# Patient Record
Sex: Female | Born: 1945 | Race: Black or African American | Hispanic: No | Marital: Single | Smoking: Former smoker
Health system: Southern US, Community
[De-identification: ages and names within clinical notes are randomized; demographics above are authoritative.]

## PROBLEM LIST (undated history)

## (undated) DIAGNOSIS — I639 Cerebral infarction, unspecified: Secondary | ICD-10-CM

## (undated) DIAGNOSIS — I671 Cerebral aneurysm, nonruptured: Secondary | ICD-10-CM

## (undated) DIAGNOSIS — I10 Essential (primary) hypertension: Secondary | ICD-10-CM

## (undated) HISTORY — PX: GASTROSTOMY W/ FEEDING TUBE: SUR642

## (undated) HISTORY — PX: SHUNT REPLACEMENT: SHX5403

---

## 2006-02-04 ENCOUNTER — Emergency Department (HOSPITAL_COMMUNITY): Admission: EM | Admit: 2006-02-04 | Discharge: 2006-02-04 | Payer: Self-pay | Admitting: Emergency Medicine

## 2006-03-01 ENCOUNTER — Ambulatory Visit: Payer: Self-pay | Admitting: Internal Medicine

## 2006-03-31 ENCOUNTER — Ambulatory Visit: Payer: Self-pay | Admitting: Internal Medicine

## 2006-04-04 ENCOUNTER — Ambulatory Visit: Payer: Self-pay | Admitting: Internal Medicine

## 2006-04-18 ENCOUNTER — Encounter: Admission: RE | Admit: 2006-04-18 | Discharge: 2006-05-11 | Payer: Self-pay | Admitting: Neurology

## 2006-04-19 ENCOUNTER — Ambulatory Visit (HOSPITAL_COMMUNITY): Admission: RE | Admit: 2006-04-19 | Discharge: 2006-04-19 | Payer: Self-pay | Admitting: Neurology

## 2006-04-29 ENCOUNTER — Ambulatory Visit: Payer: Self-pay | Admitting: Gastroenterology

## 2006-04-29 ENCOUNTER — Inpatient Hospital Stay (HOSPITAL_COMMUNITY): Admission: EM | Admit: 2006-04-29 | Discharge: 2006-05-03 | Payer: Self-pay | Admitting: Emergency Medicine

## 2006-05-01 ENCOUNTER — Ambulatory Visit: Payer: Self-pay | Admitting: Internal Medicine

## 2006-05-15 ENCOUNTER — Ambulatory Visit: Payer: Self-pay | Admitting: Internal Medicine

## 2006-06-22 ENCOUNTER — Ambulatory Visit: Payer: Self-pay | Admitting: Internal Medicine

## 2006-06-23 ENCOUNTER — Ambulatory Visit: Payer: Self-pay | Admitting: Internal Medicine

## 2014-11-04 DIAGNOSIS — I69351 Hemiplegia and hemiparesis following cerebral infarction affecting right dominant side: Secondary | ICD-10-CM | POA: Diagnosis not present

## 2014-11-04 DIAGNOSIS — Z931 Gastrostomy status: Secondary | ICD-10-CM | POA: Diagnosis not present

## 2014-11-04 DIAGNOSIS — D649 Anemia, unspecified: Secondary | ICD-10-CM | POA: Diagnosis not present

## 2014-11-04 DIAGNOSIS — E785 Hyperlipidemia, unspecified: Secondary | ICD-10-CM | POA: Diagnosis not present

## 2014-11-04 DIAGNOSIS — E669 Obesity, unspecified: Secondary | ICD-10-CM | POA: Diagnosis not present

## 2014-11-04 DIAGNOSIS — I10 Essential (primary) hypertension: Secondary | ICD-10-CM | POA: Diagnosis not present

## 2014-11-04 DIAGNOSIS — I69391 Dysphagia following cerebral infarction: Secondary | ICD-10-CM | POA: Diagnosis not present

## 2014-11-04 DIAGNOSIS — E1159 Type 2 diabetes mellitus with other circulatory complications: Secondary | ICD-10-CM | POA: Diagnosis not present

## 2014-11-04 DIAGNOSIS — I69320 Aphasia following cerebral infarction: Secondary | ICD-10-CM | POA: Diagnosis not present

## 2014-11-04 DIAGNOSIS — K219 Gastro-esophageal reflux disease without esophagitis: Secondary | ICD-10-CM | POA: Diagnosis not present

## 2014-11-04 DIAGNOSIS — I6930 Unspecified sequelae of cerebral infarction: Secondary | ICD-10-CM | POA: Diagnosis not present

## 2014-11-04 DIAGNOSIS — E559 Vitamin D deficiency, unspecified: Secondary | ICD-10-CM | POA: Diagnosis not present

## 2014-11-18 DIAGNOSIS — S72401D Unspecified fracture of lower end of right femur, subsequent encounter for closed fracture with routine healing: Secondary | ICD-10-CM | POA: Diagnosis not present

## 2015-02-10 DIAGNOSIS — I69320 Aphasia following cerebral infarction: Secondary | ICD-10-CM | POA: Diagnosis not present

## 2015-02-10 DIAGNOSIS — D649 Anemia, unspecified: Secondary | ICD-10-CM | POA: Diagnosis not present

## 2015-02-10 DIAGNOSIS — I10 Essential (primary) hypertension: Secondary | ICD-10-CM | POA: Diagnosis not present

## 2015-02-10 DIAGNOSIS — E785 Hyperlipidemia, unspecified: Secondary | ICD-10-CM | POA: Diagnosis not present

## 2015-02-10 DIAGNOSIS — I6930 Unspecified sequelae of cerebral infarction: Secondary | ICD-10-CM | POA: Diagnosis not present

## 2015-02-10 DIAGNOSIS — E669 Obesity, unspecified: Secondary | ICD-10-CM | POA: Diagnosis not present

## 2015-02-10 DIAGNOSIS — I69351 Hemiplegia and hemiparesis following cerebral infarction affecting right dominant side: Secondary | ICD-10-CM | POA: Diagnosis not present

## 2015-02-10 DIAGNOSIS — I69391 Dysphagia following cerebral infarction: Secondary | ICD-10-CM | POA: Diagnosis not present

## 2015-02-10 DIAGNOSIS — K219 Gastro-esophageal reflux disease without esophagitis: Secondary | ICD-10-CM | POA: Diagnosis not present

## 2015-02-10 DIAGNOSIS — Z931 Gastrostomy status: Secondary | ICD-10-CM | POA: Diagnosis not present

## 2015-02-10 DIAGNOSIS — E1159 Type 2 diabetes mellitus with other circulatory complications: Secondary | ICD-10-CM | POA: Diagnosis not present

## 2015-02-10 DIAGNOSIS — E559 Vitamin D deficiency, unspecified: Secondary | ICD-10-CM | POA: Diagnosis not present

## 2015-05-19 DIAGNOSIS — E669 Obesity, unspecified: Secondary | ICD-10-CM | POA: Diagnosis not present

## 2015-05-19 DIAGNOSIS — D649 Anemia, unspecified: Secondary | ICD-10-CM | POA: Diagnosis not present

## 2015-05-19 DIAGNOSIS — E1159 Type 2 diabetes mellitus with other circulatory complications: Secondary | ICD-10-CM | POA: Diagnosis not present

## 2015-05-19 DIAGNOSIS — I69351 Hemiplegia and hemiparesis following cerebral infarction affecting right dominant side: Secondary | ICD-10-CM | POA: Diagnosis not present

## 2015-05-19 DIAGNOSIS — I69391 Dysphagia following cerebral infarction: Secondary | ICD-10-CM | POA: Diagnosis not present

## 2015-05-19 DIAGNOSIS — I10 Essential (primary) hypertension: Secondary | ICD-10-CM | POA: Diagnosis not present

## 2015-05-19 DIAGNOSIS — I69320 Aphasia following cerebral infarction: Secondary | ICD-10-CM | POA: Diagnosis not present

## 2015-05-19 DIAGNOSIS — I6930 Unspecified sequelae of cerebral infarction: Secondary | ICD-10-CM | POA: Diagnosis not present

## 2015-05-19 DIAGNOSIS — K219 Gastro-esophageal reflux disease without esophagitis: Secondary | ICD-10-CM | POA: Diagnosis not present

## 2015-07-15 DIAGNOSIS — R933 Abnormal findings on diagnostic imaging of other parts of digestive tract: Secondary | ICD-10-CM | POA: Diagnosis not present

## 2015-07-15 DIAGNOSIS — R05 Cough: Secondary | ICD-10-CM | POA: Diagnosis not present

## 2015-07-15 DIAGNOSIS — J189 Pneumonia, unspecified organism: Secondary | ICD-10-CM | POA: Diagnosis not present

## 2015-08-19 DIAGNOSIS — D649 Anemia, unspecified: Secondary | ICD-10-CM | POA: Diagnosis not present

## 2015-08-19 DIAGNOSIS — I6930 Unspecified sequelae of cerebral infarction: Secondary | ICD-10-CM | POA: Diagnosis not present

## 2015-08-19 DIAGNOSIS — K219 Gastro-esophageal reflux disease without esophagitis: Secondary | ICD-10-CM | POA: Diagnosis not present

## 2015-08-19 DIAGNOSIS — E1159 Type 2 diabetes mellitus with other circulatory complications: Secondary | ICD-10-CM | POA: Diagnosis not present

## 2015-08-19 DIAGNOSIS — E669 Obesity, unspecified: Secondary | ICD-10-CM | POA: Diagnosis not present

## 2015-08-19 DIAGNOSIS — Z23 Encounter for immunization: Secondary | ICD-10-CM | POA: Diagnosis not present

## 2015-08-19 DIAGNOSIS — I69320 Aphasia following cerebral infarction: Secondary | ICD-10-CM | POA: Diagnosis not present

## 2015-08-19 DIAGNOSIS — E785 Hyperlipidemia, unspecified: Secondary | ICD-10-CM | POA: Diagnosis not present

## 2015-08-19 DIAGNOSIS — Z931 Gastrostomy status: Secondary | ICD-10-CM | POA: Diagnosis not present

## 2015-08-19 DIAGNOSIS — I69351 Hemiplegia and hemiparesis following cerebral infarction affecting right dominant side: Secondary | ICD-10-CM | POA: Diagnosis not present

## 2015-08-19 DIAGNOSIS — I69391 Dysphagia following cerebral infarction: Secondary | ICD-10-CM | POA: Diagnosis not present

## 2015-08-19 DIAGNOSIS — E559 Vitamin D deficiency, unspecified: Secondary | ICD-10-CM | POA: Diagnosis not present

## 2015-08-19 DIAGNOSIS — I10 Essential (primary) hypertension: Secondary | ICD-10-CM | POA: Diagnosis not present

## 2015-11-24 DIAGNOSIS — I10 Essential (primary) hypertension: Secondary | ICD-10-CM | POA: Diagnosis not present

## 2015-11-24 DIAGNOSIS — E669 Obesity, unspecified: Secondary | ICD-10-CM | POA: Diagnosis not present

## 2015-11-24 DIAGNOSIS — I6930 Unspecified sequelae of cerebral infarction: Secondary | ICD-10-CM | POA: Diagnosis not present

## 2015-11-24 DIAGNOSIS — E1159 Type 2 diabetes mellitus with other circulatory complications: Secondary | ICD-10-CM | POA: Diagnosis not present

## 2015-11-24 DIAGNOSIS — D649 Anemia, unspecified: Secondary | ICD-10-CM | POA: Diagnosis not present

## 2015-11-24 DIAGNOSIS — Z931 Gastrostomy status: Secondary | ICD-10-CM | POA: Diagnosis not present

## 2015-11-24 DIAGNOSIS — I69391 Dysphagia following cerebral infarction: Secondary | ICD-10-CM | POA: Diagnosis not present

## 2015-11-24 DIAGNOSIS — I69351 Hemiplegia and hemiparesis following cerebral infarction affecting right dominant side: Secondary | ICD-10-CM | POA: Diagnosis not present

## 2015-11-24 DIAGNOSIS — E559 Vitamin D deficiency, unspecified: Secondary | ICD-10-CM | POA: Diagnosis not present

## 2015-11-24 DIAGNOSIS — I69320 Aphasia following cerebral infarction: Secondary | ICD-10-CM | POA: Diagnosis not present

## 2015-11-24 DIAGNOSIS — K219 Gastro-esophageal reflux disease without esophagitis: Secondary | ICD-10-CM | POA: Diagnosis not present

## 2015-11-24 DIAGNOSIS — E785 Hyperlipidemia, unspecified: Secondary | ICD-10-CM | POA: Diagnosis not present

## 2016-02-16 DIAGNOSIS — I69391 Dysphagia following cerebral infarction: Secondary | ICD-10-CM | POA: Diagnosis not present

## 2016-02-16 DIAGNOSIS — I69320 Aphasia following cerebral infarction: Secondary | ICD-10-CM | POA: Diagnosis not present

## 2016-02-16 DIAGNOSIS — E1159 Type 2 diabetes mellitus with other circulatory complications: Secondary | ICD-10-CM | POA: Diagnosis not present

## 2016-02-16 DIAGNOSIS — Z0000 Encounter for general adult medical examination without abnormal findings: Secondary | ICD-10-CM | POA: Diagnosis not present

## 2016-02-16 DIAGNOSIS — I69351 Hemiplegia and hemiparesis following cerebral infarction affecting right dominant side: Secondary | ICD-10-CM | POA: Diagnosis not present

## 2016-02-16 DIAGNOSIS — I10 Essential (primary) hypertension: Secondary | ICD-10-CM | POA: Diagnosis not present

## 2016-02-16 DIAGNOSIS — K219 Gastro-esophageal reflux disease without esophagitis: Secondary | ICD-10-CM | POA: Diagnosis not present

## 2016-02-16 DIAGNOSIS — E669 Obesity, unspecified: Secondary | ICD-10-CM | POA: Diagnosis not present

## 2016-02-16 DIAGNOSIS — E785 Hyperlipidemia, unspecified: Secondary | ICD-10-CM | POA: Diagnosis not present

## 2016-02-16 DIAGNOSIS — I6930 Unspecified sequelae of cerebral infarction: Secondary | ICD-10-CM | POA: Diagnosis not present

## 2016-02-16 DIAGNOSIS — D649 Anemia, unspecified: Secondary | ICD-10-CM | POA: Diagnosis not present

## 2016-02-16 DIAGNOSIS — Z931 Gastrostomy status: Secondary | ICD-10-CM | POA: Diagnosis not present

## 2016-02-16 DIAGNOSIS — E559 Vitamin D deficiency, unspecified: Secondary | ICD-10-CM | POA: Diagnosis not present

## 2016-03-17 DIAGNOSIS — K9423 Gastrostomy malfunction: Secondary | ICD-10-CM | POA: Diagnosis not present

## 2016-03-17 DIAGNOSIS — Z4682 Encounter for fitting and adjustment of non-vascular catheter: Secondary | ICD-10-CM | POA: Diagnosis not present

## 2016-04-08 DIAGNOSIS — K9423 Gastrostomy malfunction: Secondary | ICD-10-CM | POA: Diagnosis not present

## 2016-05-20 DIAGNOSIS — I10 Essential (primary) hypertension: Secondary | ICD-10-CM | POA: Diagnosis not present

## 2016-05-20 DIAGNOSIS — E559 Vitamin D deficiency, unspecified: Secondary | ICD-10-CM | POA: Diagnosis not present

## 2016-05-20 DIAGNOSIS — I69320 Aphasia following cerebral infarction: Secondary | ICD-10-CM | POA: Diagnosis not present

## 2016-05-20 DIAGNOSIS — I69391 Dysphagia following cerebral infarction: Secondary | ICD-10-CM | POA: Diagnosis not present

## 2016-05-20 DIAGNOSIS — I69351 Hemiplegia and hemiparesis following cerebral infarction affecting right dominant side: Secondary | ICD-10-CM | POA: Diagnosis not present

## 2016-05-20 DIAGNOSIS — E1159 Type 2 diabetes mellitus with other circulatory complications: Secondary | ICD-10-CM | POA: Diagnosis not present

## 2016-05-20 DIAGNOSIS — K219 Gastro-esophageal reflux disease without esophagitis: Secondary | ICD-10-CM | POA: Diagnosis not present

## 2016-05-20 DIAGNOSIS — E785 Hyperlipidemia, unspecified: Secondary | ICD-10-CM | POA: Diagnosis not present

## 2016-05-20 DIAGNOSIS — Z931 Gastrostomy status: Secondary | ICD-10-CM | POA: Diagnosis not present

## 2016-05-20 DIAGNOSIS — E669 Obesity, unspecified: Secondary | ICD-10-CM | POA: Diagnosis not present

## 2016-05-20 DIAGNOSIS — D649 Anemia, unspecified: Secondary | ICD-10-CM | POA: Diagnosis not present

## 2016-05-20 DIAGNOSIS — I6930 Unspecified sequelae of cerebral infarction: Secondary | ICD-10-CM | POA: Diagnosis not present

## 2016-08-04 DIAGNOSIS — I10 Essential (primary) hypertension: Secondary | ICD-10-CM | POA: Diagnosis not present

## 2016-08-04 DIAGNOSIS — E7800 Pure hypercholesterolemia, unspecified: Secondary | ICD-10-CM | POA: Diagnosis not present

## 2016-08-04 DIAGNOSIS — Z934 Other artificial openings of gastrointestinal tract status: Secondary | ICD-10-CM | POA: Diagnosis not present

## 2016-08-04 DIAGNOSIS — Z9889 Other specified postprocedural states: Secondary | ICD-10-CM | POA: Diagnosis not present

## 2016-08-04 DIAGNOSIS — I69998 Other sequelae following unspecified cerebrovascular disease: Secondary | ICD-10-CM | POA: Diagnosis not present

## 2016-08-04 DIAGNOSIS — E46 Unspecified protein-calorie malnutrition: Secondary | ICD-10-CM | POA: Diagnosis not present

## 2016-08-04 DIAGNOSIS — Z993 Dependence on wheelchair: Secondary | ICD-10-CM | POA: Diagnosis not present

## 2016-08-04 DIAGNOSIS — I679 Cerebrovascular disease, unspecified: Secondary | ICD-10-CM | POA: Diagnosis not present

## 2016-08-12 DIAGNOSIS — R1310 Dysphagia, unspecified: Secondary | ICD-10-CM | POA: Diagnosis not present

## 2016-08-12 DIAGNOSIS — I10 Essential (primary) hypertension: Secondary | ICD-10-CM | POA: Diagnosis not present

## 2016-08-12 DIAGNOSIS — F419 Anxiety disorder, unspecified: Secondary | ICD-10-CM | POA: Diagnosis not present

## 2016-08-12 DIAGNOSIS — I69398 Other sequelae of cerebral infarction: Secondary | ICD-10-CM | POA: Diagnosis not present

## 2016-08-12 DIAGNOSIS — F418 Other specified anxiety disorders: Secondary | ICD-10-CM | POA: Diagnosis not present

## 2016-08-12 DIAGNOSIS — M6281 Muscle weakness (generalized): Secondary | ICD-10-CM | POA: Diagnosis not present

## 2016-08-12 DIAGNOSIS — Z87891 Personal history of nicotine dependence: Secondary | ICD-10-CM | POA: Diagnosis not present

## 2016-08-12 DIAGNOSIS — I69320 Aphasia following cerebral infarction: Secondary | ICD-10-CM | POA: Diagnosis not present

## 2016-08-12 DIAGNOSIS — E46 Unspecified protein-calorie malnutrition: Secondary | ICD-10-CM | POA: Diagnosis not present

## 2016-08-12 DIAGNOSIS — Z931 Gastrostomy status: Secondary | ICD-10-CM | POA: Diagnosis not present

## 2016-08-12 DIAGNOSIS — I69391 Dysphagia following cerebral infarction: Secondary | ICD-10-CM | POA: Diagnosis not present

## 2016-08-12 DIAGNOSIS — Z993 Dependence on wheelchair: Secondary | ICD-10-CM | POA: Diagnosis not present

## 2016-08-15 DIAGNOSIS — F419 Anxiety disorder, unspecified: Secondary | ICD-10-CM | POA: Diagnosis not present

## 2016-08-15 DIAGNOSIS — I69398 Other sequelae of cerebral infarction: Secondary | ICD-10-CM | POA: Diagnosis not present

## 2016-08-15 DIAGNOSIS — I10 Essential (primary) hypertension: Secondary | ICD-10-CM | POA: Diagnosis not present

## 2016-08-15 DIAGNOSIS — M6281 Muscle weakness (generalized): Secondary | ICD-10-CM | POA: Diagnosis not present

## 2016-08-15 DIAGNOSIS — I69391 Dysphagia following cerebral infarction: Secondary | ICD-10-CM | POA: Diagnosis not present

## 2016-08-15 DIAGNOSIS — R1310 Dysphagia, unspecified: Secondary | ICD-10-CM | POA: Diagnosis not present

## 2016-08-17 DIAGNOSIS — M6281 Muscle weakness (generalized): Secondary | ICD-10-CM | POA: Diagnosis not present

## 2016-08-17 DIAGNOSIS — I10 Essential (primary) hypertension: Secondary | ICD-10-CM | POA: Diagnosis not present

## 2016-08-17 DIAGNOSIS — R1310 Dysphagia, unspecified: Secondary | ICD-10-CM | POA: Diagnosis not present

## 2016-08-17 DIAGNOSIS — I69391 Dysphagia following cerebral infarction: Secondary | ICD-10-CM | POA: Diagnosis not present

## 2016-08-17 DIAGNOSIS — F419 Anxiety disorder, unspecified: Secondary | ICD-10-CM | POA: Diagnosis not present

## 2016-08-17 DIAGNOSIS — I69398 Other sequelae of cerebral infarction: Secondary | ICD-10-CM | POA: Diagnosis not present

## 2016-08-18 DIAGNOSIS — M6281 Muscle weakness (generalized): Secondary | ICD-10-CM | POA: Diagnosis not present

## 2016-08-18 DIAGNOSIS — F419 Anxiety disorder, unspecified: Secondary | ICD-10-CM | POA: Diagnosis not present

## 2016-08-18 DIAGNOSIS — I10 Essential (primary) hypertension: Secondary | ICD-10-CM | POA: Diagnosis not present

## 2016-08-18 DIAGNOSIS — R1310 Dysphagia, unspecified: Secondary | ICD-10-CM | POA: Diagnosis not present

## 2016-08-18 DIAGNOSIS — I69391 Dysphagia following cerebral infarction: Secondary | ICD-10-CM | POA: Diagnosis not present

## 2016-08-18 DIAGNOSIS — I69398 Other sequelae of cerebral infarction: Secondary | ICD-10-CM | POA: Diagnosis not present

## 2016-08-25 DIAGNOSIS — I69398 Other sequelae of cerebral infarction: Secondary | ICD-10-CM | POA: Diagnosis not present

## 2016-08-25 DIAGNOSIS — I10 Essential (primary) hypertension: Secondary | ICD-10-CM | POA: Diagnosis not present

## 2016-08-25 DIAGNOSIS — M6281 Muscle weakness (generalized): Secondary | ICD-10-CM | POA: Diagnosis not present

## 2016-08-25 DIAGNOSIS — F419 Anxiety disorder, unspecified: Secondary | ICD-10-CM | POA: Diagnosis not present

## 2016-08-25 DIAGNOSIS — R1310 Dysphagia, unspecified: Secondary | ICD-10-CM | POA: Diagnosis not present

## 2016-08-25 DIAGNOSIS — I69391 Dysphagia following cerebral infarction: Secondary | ICD-10-CM | POA: Diagnosis not present

## 2016-09-01 DIAGNOSIS — I10 Essential (primary) hypertension: Secondary | ICD-10-CM | POA: Diagnosis not present

## 2016-09-01 DIAGNOSIS — F419 Anxiety disorder, unspecified: Secondary | ICD-10-CM | POA: Diagnosis not present

## 2016-09-01 DIAGNOSIS — M6281 Muscle weakness (generalized): Secondary | ICD-10-CM | POA: Diagnosis not present

## 2016-09-01 DIAGNOSIS — I69391 Dysphagia following cerebral infarction: Secondary | ICD-10-CM | POA: Diagnosis not present

## 2016-09-01 DIAGNOSIS — I69398 Other sequelae of cerebral infarction: Secondary | ICD-10-CM | POA: Diagnosis not present

## 2016-09-01 DIAGNOSIS — R1310 Dysphagia, unspecified: Secondary | ICD-10-CM | POA: Diagnosis not present

## 2016-09-02 DIAGNOSIS — I10 Essential (primary) hypertension: Secondary | ICD-10-CM | POA: Diagnosis not present

## 2016-09-02 DIAGNOSIS — I69391 Dysphagia following cerebral infarction: Secondary | ICD-10-CM | POA: Diagnosis not present

## 2016-09-02 DIAGNOSIS — R1310 Dysphagia, unspecified: Secondary | ICD-10-CM | POA: Diagnosis not present

## 2016-09-02 DIAGNOSIS — F419 Anxiety disorder, unspecified: Secondary | ICD-10-CM | POA: Diagnosis not present

## 2016-09-02 DIAGNOSIS — I69398 Other sequelae of cerebral infarction: Secondary | ICD-10-CM | POA: Diagnosis not present

## 2016-09-02 DIAGNOSIS — M6281 Muscle weakness (generalized): Secondary | ICD-10-CM | POA: Diagnosis not present

## 2016-09-05 DIAGNOSIS — M6281 Muscle weakness (generalized): Secondary | ICD-10-CM | POA: Diagnosis not present

## 2016-09-05 DIAGNOSIS — I10 Essential (primary) hypertension: Secondary | ICD-10-CM | POA: Diagnosis not present

## 2016-09-05 DIAGNOSIS — R1310 Dysphagia, unspecified: Secondary | ICD-10-CM | POA: Diagnosis not present

## 2016-09-05 DIAGNOSIS — I69398 Other sequelae of cerebral infarction: Secondary | ICD-10-CM | POA: Diagnosis not present

## 2016-09-05 DIAGNOSIS — I69391 Dysphagia following cerebral infarction: Secondary | ICD-10-CM | POA: Diagnosis not present

## 2016-09-05 DIAGNOSIS — F419 Anxiety disorder, unspecified: Secondary | ICD-10-CM | POA: Diagnosis not present

## 2016-09-06 DIAGNOSIS — R1310 Dysphagia, unspecified: Secondary | ICD-10-CM | POA: Diagnosis not present

## 2016-09-06 DIAGNOSIS — E46 Unspecified protein-calorie malnutrition: Secondary | ICD-10-CM | POA: Diagnosis not present

## 2016-09-06 DIAGNOSIS — Z993 Dependence on wheelchair: Secondary | ICD-10-CM | POA: Diagnosis not present

## 2016-09-06 DIAGNOSIS — I69351 Hemiplegia and hemiparesis following cerebral infarction affecting right dominant side: Secondary | ICD-10-CM | POA: Diagnosis not present

## 2016-09-06 DIAGNOSIS — Z431 Encounter for attention to gastrostomy: Secondary | ICD-10-CM | POA: Diagnosis not present

## 2016-09-06 DIAGNOSIS — E7800 Pure hypercholesterolemia, unspecified: Secondary | ICD-10-CM | POA: Diagnosis not present

## 2016-09-06 DIAGNOSIS — K219 Gastro-esophageal reflux disease without esophagitis: Secondary | ICD-10-CM | POA: Diagnosis not present

## 2016-09-06 DIAGNOSIS — F419 Anxiety disorder, unspecified: Secondary | ICD-10-CM | POA: Diagnosis not present

## 2016-09-06 DIAGNOSIS — I10 Essential (primary) hypertension: Secondary | ICD-10-CM | POA: Diagnosis not present

## 2016-09-06 DIAGNOSIS — I69391 Dysphagia following cerebral infarction: Secondary | ICD-10-CM | POA: Diagnosis not present

## 2016-09-06 DIAGNOSIS — Z87891 Personal history of nicotine dependence: Secondary | ICD-10-CM | POA: Diagnosis not present

## 2016-09-12 DIAGNOSIS — I69391 Dysphagia following cerebral infarction: Secondary | ICD-10-CM | POA: Diagnosis not present

## 2016-09-12 DIAGNOSIS — Z431 Encounter for attention to gastrostomy: Secondary | ICD-10-CM | POA: Diagnosis not present

## 2016-09-12 DIAGNOSIS — I69351 Hemiplegia and hemiparesis following cerebral infarction affecting right dominant side: Secondary | ICD-10-CM | POA: Diagnosis not present

## 2016-09-12 DIAGNOSIS — K219 Gastro-esophageal reflux disease without esophagitis: Secondary | ICD-10-CM | POA: Diagnosis not present

## 2016-09-12 DIAGNOSIS — R1310 Dysphagia, unspecified: Secondary | ICD-10-CM | POA: Diagnosis not present

## 2016-09-12 DIAGNOSIS — E46 Unspecified protein-calorie malnutrition: Secondary | ICD-10-CM | POA: Diagnosis not present

## 2016-09-13 DIAGNOSIS — Z431 Encounter for attention to gastrostomy: Secondary | ICD-10-CM | POA: Diagnosis not present

## 2016-09-13 DIAGNOSIS — E46 Unspecified protein-calorie malnutrition: Secondary | ICD-10-CM | POA: Diagnosis not present

## 2016-09-13 DIAGNOSIS — I69391 Dysphagia following cerebral infarction: Secondary | ICD-10-CM | POA: Diagnosis not present

## 2016-09-13 DIAGNOSIS — I69351 Hemiplegia and hemiparesis following cerebral infarction affecting right dominant side: Secondary | ICD-10-CM | POA: Diagnosis not present

## 2016-09-13 DIAGNOSIS — K219 Gastro-esophageal reflux disease without esophagitis: Secondary | ICD-10-CM | POA: Diagnosis not present

## 2016-09-13 DIAGNOSIS — R1310 Dysphagia, unspecified: Secondary | ICD-10-CM | POA: Diagnosis not present

## 2016-09-14 DIAGNOSIS — K219 Gastro-esophageal reflux disease without esophagitis: Secondary | ICD-10-CM | POA: Diagnosis not present

## 2016-09-14 DIAGNOSIS — I69391 Dysphagia following cerebral infarction: Secondary | ICD-10-CM | POA: Diagnosis not present

## 2016-09-14 DIAGNOSIS — E46 Unspecified protein-calorie malnutrition: Secondary | ICD-10-CM | POA: Diagnosis not present

## 2016-09-14 DIAGNOSIS — Z431 Encounter for attention to gastrostomy: Secondary | ICD-10-CM | POA: Diagnosis not present

## 2016-09-14 DIAGNOSIS — I69351 Hemiplegia and hemiparesis following cerebral infarction affecting right dominant side: Secondary | ICD-10-CM | POA: Diagnosis not present

## 2016-09-14 DIAGNOSIS — R1310 Dysphagia, unspecified: Secondary | ICD-10-CM | POA: Diagnosis not present

## 2016-09-20 DIAGNOSIS — R1310 Dysphagia, unspecified: Secondary | ICD-10-CM | POA: Diagnosis not present

## 2016-09-20 DIAGNOSIS — Z431 Encounter for attention to gastrostomy: Secondary | ICD-10-CM | POA: Diagnosis not present

## 2016-09-20 DIAGNOSIS — I69351 Hemiplegia and hemiparesis following cerebral infarction affecting right dominant side: Secondary | ICD-10-CM | POA: Diagnosis not present

## 2016-09-20 DIAGNOSIS — K219 Gastro-esophageal reflux disease without esophagitis: Secondary | ICD-10-CM | POA: Diagnosis not present

## 2016-09-20 DIAGNOSIS — I69391 Dysphagia following cerebral infarction: Secondary | ICD-10-CM | POA: Diagnosis not present

## 2016-09-20 DIAGNOSIS — E46 Unspecified protein-calorie malnutrition: Secondary | ICD-10-CM | POA: Diagnosis not present

## 2016-09-22 DIAGNOSIS — E46 Unspecified protein-calorie malnutrition: Secondary | ICD-10-CM | POA: Diagnosis not present

## 2016-09-22 DIAGNOSIS — Z431 Encounter for attention to gastrostomy: Secondary | ICD-10-CM | POA: Diagnosis not present

## 2016-09-22 DIAGNOSIS — I69351 Hemiplegia and hemiparesis following cerebral infarction affecting right dominant side: Secondary | ICD-10-CM | POA: Diagnosis not present

## 2016-09-22 DIAGNOSIS — I69391 Dysphagia following cerebral infarction: Secondary | ICD-10-CM | POA: Diagnosis not present

## 2016-09-22 DIAGNOSIS — R1310 Dysphagia, unspecified: Secondary | ICD-10-CM | POA: Diagnosis not present

## 2016-09-22 DIAGNOSIS — K219 Gastro-esophageal reflux disease without esophagitis: Secondary | ICD-10-CM | POA: Diagnosis not present

## 2016-09-27 DIAGNOSIS — E46 Unspecified protein-calorie malnutrition: Secondary | ICD-10-CM | POA: Diagnosis not present

## 2016-09-27 DIAGNOSIS — I69351 Hemiplegia and hemiparesis following cerebral infarction affecting right dominant side: Secondary | ICD-10-CM | POA: Diagnosis not present

## 2016-09-27 DIAGNOSIS — Z431 Encounter for attention to gastrostomy: Secondary | ICD-10-CM | POA: Diagnosis not present

## 2016-09-27 DIAGNOSIS — K219 Gastro-esophageal reflux disease without esophagitis: Secondary | ICD-10-CM | POA: Diagnosis not present

## 2016-09-27 DIAGNOSIS — I69391 Dysphagia following cerebral infarction: Secondary | ICD-10-CM | POA: Diagnosis not present

## 2016-09-27 DIAGNOSIS — R1310 Dysphagia, unspecified: Secondary | ICD-10-CM | POA: Diagnosis not present

## 2016-09-29 DIAGNOSIS — I69351 Hemiplegia and hemiparesis following cerebral infarction affecting right dominant side: Secondary | ICD-10-CM | POA: Diagnosis not present

## 2016-09-29 DIAGNOSIS — E46 Unspecified protein-calorie malnutrition: Secondary | ICD-10-CM | POA: Diagnosis not present

## 2016-09-29 DIAGNOSIS — I69391 Dysphagia following cerebral infarction: Secondary | ICD-10-CM | POA: Diagnosis not present

## 2016-09-29 DIAGNOSIS — R1310 Dysphagia, unspecified: Secondary | ICD-10-CM | POA: Diagnosis not present

## 2016-09-29 DIAGNOSIS — K219 Gastro-esophageal reflux disease without esophagitis: Secondary | ICD-10-CM | POA: Diagnosis not present

## 2016-09-29 DIAGNOSIS — Z431 Encounter for attention to gastrostomy: Secondary | ICD-10-CM | POA: Diagnosis not present

## 2016-10-04 DIAGNOSIS — R1310 Dysphagia, unspecified: Secondary | ICD-10-CM | POA: Diagnosis not present

## 2016-10-04 DIAGNOSIS — I69351 Hemiplegia and hemiparesis following cerebral infarction affecting right dominant side: Secondary | ICD-10-CM | POA: Diagnosis not present

## 2016-10-04 DIAGNOSIS — K219 Gastro-esophageal reflux disease without esophagitis: Secondary | ICD-10-CM | POA: Diagnosis not present

## 2016-10-04 DIAGNOSIS — Z431 Encounter for attention to gastrostomy: Secondary | ICD-10-CM | POA: Diagnosis not present

## 2016-10-04 DIAGNOSIS — E46 Unspecified protein-calorie malnutrition: Secondary | ICD-10-CM | POA: Diagnosis not present

## 2016-10-04 DIAGNOSIS — I69391 Dysphagia following cerebral infarction: Secondary | ICD-10-CM | POA: Diagnosis not present

## 2016-10-06 DIAGNOSIS — Z431 Encounter for attention to gastrostomy: Secondary | ICD-10-CM | POA: Diagnosis not present

## 2016-10-06 DIAGNOSIS — R1310 Dysphagia, unspecified: Secondary | ICD-10-CM | POA: Diagnosis not present

## 2016-10-06 DIAGNOSIS — E46 Unspecified protein-calorie malnutrition: Secondary | ICD-10-CM | POA: Diagnosis not present

## 2016-10-06 DIAGNOSIS — I69351 Hemiplegia and hemiparesis following cerebral infarction affecting right dominant side: Secondary | ICD-10-CM | POA: Diagnosis not present

## 2016-10-06 DIAGNOSIS — K219 Gastro-esophageal reflux disease without esophagitis: Secondary | ICD-10-CM | POA: Diagnosis not present

## 2016-10-06 DIAGNOSIS — I69391 Dysphagia following cerebral infarction: Secondary | ICD-10-CM | POA: Diagnosis not present

## 2016-10-12 ENCOUNTER — Ambulatory Visit: Payer: Medicare Other | Admitting: *Deleted

## 2016-10-20 DIAGNOSIS — I69351 Hemiplegia and hemiparesis following cerebral infarction affecting right dominant side: Secondary | ICD-10-CM | POA: Diagnosis not present

## 2016-10-20 DIAGNOSIS — K219 Gastro-esophageal reflux disease without esophagitis: Secondary | ICD-10-CM | POA: Diagnosis not present

## 2016-10-20 DIAGNOSIS — E46 Unspecified protein-calorie malnutrition: Secondary | ICD-10-CM | POA: Diagnosis not present

## 2016-10-20 DIAGNOSIS — R1310 Dysphagia, unspecified: Secondary | ICD-10-CM | POA: Diagnosis not present

## 2016-10-20 DIAGNOSIS — I69391 Dysphagia following cerebral infarction: Secondary | ICD-10-CM | POA: Diagnosis not present

## 2016-10-20 DIAGNOSIS — Z431 Encounter for attention to gastrostomy: Secondary | ICD-10-CM | POA: Diagnosis not present

## 2016-10-25 ENCOUNTER — Ambulatory Visit: Payer: Medicare Other | Admitting: *Deleted

## 2016-10-31 DIAGNOSIS — I69351 Hemiplegia and hemiparesis following cerebral infarction affecting right dominant side: Secondary | ICD-10-CM | POA: Diagnosis not present

## 2016-10-31 DIAGNOSIS — I69391 Dysphagia following cerebral infarction: Secondary | ICD-10-CM | POA: Diagnosis not present

## 2016-10-31 DIAGNOSIS — R1310 Dysphagia, unspecified: Secondary | ICD-10-CM | POA: Diagnosis not present

## 2016-10-31 DIAGNOSIS — K219 Gastro-esophageal reflux disease without esophagitis: Secondary | ICD-10-CM | POA: Diagnosis not present

## 2016-10-31 DIAGNOSIS — Z431 Encounter for attention to gastrostomy: Secondary | ICD-10-CM | POA: Diagnosis not present

## 2016-10-31 DIAGNOSIS — E46 Unspecified protein-calorie malnutrition: Secondary | ICD-10-CM | POA: Diagnosis not present

## 2016-11-03 ENCOUNTER — Ambulatory Visit: Payer: Medicare Other | Attending: Internal Medicine | Admitting: Occupational Therapy

## 2016-11-03 DIAGNOSIS — G8111 Spastic hemiplegia affecting right dominant side: Secondary | ICD-10-CM | POA: Insufficient documentation

## 2016-11-03 DIAGNOSIS — IMO0002 Reserved for concepts with insufficient information to code with codable children: Secondary | ICD-10-CM

## 2016-11-03 DIAGNOSIS — I639 Cerebral infarction, unspecified: Secondary | ICD-10-CM | POA: Diagnosis not present

## 2016-11-03 NOTE — Therapy (Signed)
[facility] [address] [place], [place], [address] Phone: [phone]   Fax:  [phone]  Occupational Therapy Evaluation  Patient Details  Name: Arda Daggs MRN: [id number] Date of Birth: [birth year] Referring Provider: Dr. Guerry Bruin  Encounter Date: 11/03/2016      OT End of Session - 11/03/16 1151    Visit Number 1   Number of Visits 4   Date for OT Re-Evaluation 12/04/16   Authorization Type MCR - G code required   Authorization - Visit Number 1   Authorization - Number of Visits 10   OT Start Time 1015   OT Stop Time 1130   OT Time Calculation (min) 75 min   Activity Tolerance Patient tolerated treatment well      No past medical history on file.  No past surgical history on file.  There were no vitals filed for this visit.      Subjective Assessment - 11/03/16 1028    Subjective  Pt non verbal   Patient is accompained by: Family member  daughter   Pertinent History CVA 11/2005, Shunt placement 09/2006   Limitations **NPO, G tube   Currently in Pain? No/denies           [facility] OT Assessment - 11/03/16 0001      Assessment   Diagnosis muscle contracture Rt arm   Referring Provider Dr. Guerry Bruin   Onset Date --  11/2005 - CVA with dense Rt spastic hemiplegia   Assessment Pt w/c bound and requires hoyer lift for transfers. Pt non verbal and incosistently responds to 1 step demo commands    Prior Therapy home health     Precautions   Precautions --  NPO   Precaution Comments NPO - G tube     Home  Environment   Additional Comments Pt lives with daughter. Pt lives on 1st floor with ramp to enter   Lives With Daughter  Daughter also has aide come in to assist with ADLS     Prior Function   Level of Independence --  dependent since 2007     ADL   ADL comments Dependent for all ADLS. Has G tube      Mobility   Mobility Status Comments w/c bound     Tone   Assessment  Location Right Upper Extremity     ROM / Strength   AROM / PROM / Strength AROM;PROM     AROM   Overall AROM Comments Pt has no A/ROM RUE.  LUE WFL's except MP joints remain flexed with no active extension     PROM   Overall PROM Comments Pt has less than 50% P/ROM secondary to severe spasticity.      RUE Tone   RUE Tone Severe                  OT Treatments/Exercises (OP) - 11/03/16 0001      Splinting   Splinting Pt referred for new Rt arm splints. Fabricated and fitted resting hand splint with modifications d/t spasticity. Pt's daughter educated in wear and care, hygiene care and skin care. Issued splint but may need adjustments; or if pt cannot tolerate, may need to order pre-fab splint. Pt/daughter also issued soft bean bag splint for Rt elbow to wear at night to prevent full elbow flexion               OT Education - 11/03/16 1131    Education provided Yes  Education Details splint wear and care, hygiene care, and skin care (monitoring skin closely for pressure areas/sores)    Person(s) Educated Patient;Child(ren)   Methods Explanation;Demonstration   Comprehension Verbalized understanding             OT Long Term Goals - 11/03/16 1158      OT LONG TERM GOAL #1   Title Pt's daughter (primary caregiver) will be independent with splint wear and care to prevent further contractures for pt    Time 4   Period Weeks   Status New     OT LONG TERM GOAL #2   Title Pt's daughter will verbalize understanding with proper P/ROM techniques for pt   Time 4   Period Weeks   Status New               Plan - 11/03/16 1154    Clinical Impression Statement Pt is a 71 y.o. 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 Pt is referred to outpatient for new Rt arm splint and is accompanied by her daughter (pt non verbal). Pt with spasticity and would benefit from O.T. to address splinting needs  to prevent further contracture of RUE.    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 [facility](HCC) - Plan: Ot plan of care cert/re-cert      G-Codes - 11/03/16 1209    Functional Assessment Tool Used clinical judgment based on spasticity and joint contractures RUE   Functional Limitation Changing and maintaining body position   Changing and Maintaining Body Position Current Status (Z6109(G8981) At least 80 percent but less than 100 percent impaired, limited or restricted   Changing and Maintaining Body Position Goal Status (U0454(G8982) At least 60 percent but less than 80 percent impaired, limited or restricted      Problem List There are no active problems to display for this patient.   Kelli ChurnBallie, Rulon Abdalla Johnson, OTR/L 11/03/2016, 12:10 PM  [facility] [address] [place], [place], [address] Phone: [phone]   Fax:  [phone]  Name: Liliane ShiLois Hodo MRN: [id number] Date of Birth: [birth year]

## 2016-11-03 NOTE — Patient Instructions (Signed)
Your Splint This splint should initially be fitted by a healthcare practitioner.  The healthcare practitioner is responsible for providing wearing instructions and precautions to the patient, other healthcare practitioners and care provider involved in the patient's care.  This splint was custom made for you. Please read the following instructions to learn about wearing and caring for your splint.  Precautions Should your splint cause any of the following problems, remove the splint immediately and contact your therapist/physician.  Swelling  Severe Pain  Pressure Areas  Stiffness  Numbness  Do not wear your splint while operating machinery unless it has been fabricated for that purpose.  When To Wear Your Splint Where your splint according to your therapist/physician instructions. Wear during day, monitor skin closely for any pressure areas/sores every 2 hours for the 1st 2 days. Then gradually extend by one hour each time (3 hours, if no problems, then 4 hours). Once pt can tolerate 6 consecutive hours with no problems or skin breakdown, then may begin wearing at night   Care and Cleaning of Your Splint 1. Keep your splint away from open flames. 2. Your splint will lose its shape in temperatures over 135 degrees Farenheit, ( in car windows, near radiators, ovens or in hot water).  Never make any adjustments to your splint, if the splint needs adjusting remove it and make an appointment to see your therapist. 3. Your splint may be cleaned with rubbing alcohol.  Do not immerse in hot water over 135 degrees Farenheit. 4. Straps may be washed with soap and water, but do not moisten the self-adhesive portion. 5. For ink or hard to remove spots use a scouring cleanser which contains chlorine.  Rinse the splint thoroughly after using chlorine cleanser.

## 2016-11-14 ENCOUNTER — Ambulatory Visit: Payer: Medicare Other | Admitting: Occupational Therapy

## 2016-11-14 DIAGNOSIS — I639 Cerebral infarction, unspecified: Secondary | ICD-10-CM | POA: Diagnosis not present

## 2016-11-14 DIAGNOSIS — G8111 Spastic hemiplegia affecting right dominant side: Secondary | ICD-10-CM | POA: Diagnosis not present

## 2016-11-14 DIAGNOSIS — IMO0002 Reserved for concepts with insufficient information to code with codable children: Secondary | ICD-10-CM

## 2016-11-14 NOTE — Therapy (Signed)
[facility] [address] [place], [place], [address] Phone: [phone]   Fax:  [phone]  Occupational Therapy Treatment  Patient Details  Name: Emma [name] MRN: [id number] Date of Birth: January 26, 1946 Referring Provider: Dr. Domenick Gong  Encounter Date: 11/14/2016      OT End of Session - 11/14/16 1057    Visit Number 2   Number of Visits 4   Date for OT Re-Evaluation 12/04/16   Authorization Type MCR - G code required   Authorization - Visit Number 2   Authorization - Number of Visits 10   OT Start Time 7741   OT Stop Time 1050   OT Time Calculation (min) 35 min   Activity Tolerance Patient tolerated treatment well      No past medical history on file.  No past surgical history on file.  There were no vitals filed for this visit.      Subjective Assessment - 11/14/16 1053    Subjective  Pt non verbal. Daughter reports no problems with splint and no signs of pressure areas or skin breakdown   Patient is accompained by: Family member  daughter   Pertinent History CVA 11/2005, Shunt placement 09/2006   Limitations **NPO, G tube   Currently in Pain? No/denies                      OT Treatments/Exercises (OP) - 11/14/16 0001      Splinting   Splinting Therapist assessed splint and pt's skin with no signs of problems, skin breakdown, or pressure areas. Reviewed wear and care with patient's daughter and pt to gradually increase wearing time t/o day. Therapist made minor adjustment to wrist strap, then provided extra strap, hooks, and liner for splint. Also discussed other pre-fab options and provided handout of recommended pre-fab splint options if pt could no longer tolerate fabricated splint. Daughter verbalized understanding with all education                OT Education - 11/14/16 1056    Education provided Yes   Education Details review of splint wear and care, hygiene care and  skin care   Person(s) Educated Patient;Child(ren)   Methods Explanation   Comprehension Verbalized understanding             OT Long Term Goals - 11/14/16 1057      OT LONG TERM GOAL #1   Title Pt's daughter (primary caregiver) will be independent with splint wear and care to prevent further contractures for pt    Time 4   Period Weeks   Status Achieved     OT LONG TERM GOAL #2   Title Pt's daughter will verbalize understanding with proper P/ROM techniques for pt   Time 4   Period Weeks   Status Deferred               Plan - 11/14/16 1058    Clinical Impression Statement Pt met LTG #1 and deferred LTG #2 (Daughter reports knowing how to perform).    Rehab Potential Fair   OT Frequency 1x / week   OT Duration 4 weeks   OT Treatment/Interventions Self-care/ADL training;Splinting;Patient/family education;Passive range of motion   Plan Will d/c if pt does not need to return by 12/04/16. 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   Carey Bullocks, OTR/L 11/14/2016, 11:00 AM  [facility] [address] [place], [place], [address] Phone: [phone]   Fax:  [phone]  Name: Nehemie Casserly MRN: [id number] Date of Birth: 1946/01/05

## 2016-11-21 ENCOUNTER — Ambulatory Visit: Payer: Medicare Other | Admitting: Occupational Therapy

## 2017-01-04 ENCOUNTER — Emergency Department (HOSPITAL_COMMUNITY): Payer: Medicare Other

## 2017-01-04 ENCOUNTER — Emergency Department (HOSPITAL_COMMUNITY)
Admission: EM | Admit: 2017-01-04 | Discharge: 2017-01-04 | Disposition: A | Discharge status: Home / Self Care | Payer: Medicare Other | Attending: Emergency Medicine | Admitting: Emergency Medicine

## 2017-01-04 ENCOUNTER — Encounter (HOSPITAL_COMMUNITY): Payer: Self-pay

## 2017-01-04 DIAGNOSIS — Z87891 Personal history of nicotine dependence: Secondary | ICD-10-CM | POA: Diagnosis not present

## 2017-01-04 DIAGNOSIS — Z931 Gastrostomy status: Secondary | ICD-10-CM | POA: Diagnosis not present

## 2017-01-04 DIAGNOSIS — Z8673 Personal history of transient ischemic attack (TIA), and cerebral infarction without residual deficits: Secondary | ICD-10-CM | POA: Insufficient documentation

## 2017-01-04 DIAGNOSIS — T85598A Other mechanical complication of other gastrointestinal prosthetic devices, implants and grafts, initial encounter: Secondary | ICD-10-CM | POA: Diagnosis not present

## 2017-01-04 DIAGNOSIS — I10 Essential (primary) hypertension: Secondary | ICD-10-CM | POA: Insufficient documentation

## 2017-01-04 DIAGNOSIS — Z4659 Encounter for fitting and adjustment of other gastrointestinal appliance and device: Secondary | ICD-10-CM

## 2017-01-04 DIAGNOSIS — Y733 Surgical instruments, materials and gastroenterology and urology devices (including sutures) associated with adverse incidents: Secondary | ICD-10-CM | POA: Diagnosis not present

## 2017-01-04 DIAGNOSIS — K9429 Other complications of gastrostomy: Secondary | ICD-10-CM | POA: Diagnosis not present

## 2017-01-04 DIAGNOSIS — K9420 Gastrostomy complication, unspecified: Secondary | ICD-10-CM

## 2017-01-04 HISTORY — DX: Cerebral aneurysm, nonruptured: I67.1

## 2017-01-04 HISTORY — DX: Cerebral infarction, unspecified: I63.9

## 2017-01-04 HISTORY — DX: Essential (primary) hypertension: I10

## 2017-01-04 MED ORDER — IOPAMIDOL (ISOVUE-300) INJECTION 61%
INTRAVENOUS | Status: AC
  Administered 2017-01-04: 30 mL via GASTROSTOMY
  Filled 2017-01-04: qty 50

## 2017-01-04 NOTE — ED Notes (Signed)
ED Provider at bedside. 

## 2017-01-04 NOTE — ED Provider Notes (Signed)
[facility]-EMERGENCY DEPT Provider Note   CSN: [id number] Arrival date & time: 01/04/17  1010     History   Chief Complaint Chief Complaint  Patient presents with  . feeding tube    HPI Emma [name] is a 71 y.o. female.  HPI   71 year old female with history of stroke, hypertension, G-tube presents today with G-tube complications.  Patient is nonverbal secondary to stroke, her daughter is at bedside reports that around 9 AM this morning (approximately 3 hours prior to evaluation) patient's G-tube was accidentally pulled out.  She notes no discomfort, no abdominal pain.  She notes this is happened before and is been easily replaced.  She notes this was placed in 2007 secondary to inability to tolerate solids.  Patient has not had food or drink since yesterday.  She reports patient uses a 20-french catheter.    Past Medical History:  Diagnosis Date  . Brain aneurysm   . Hypertension   . Stroke [facility])     There are no active problems to display for this patient.   Past Surgical History:  Procedure Laterality Date  . GASTROSTOMY W/ FEEDING TUBE    . SHUNT REPLACEMENT      OB History    No data available      Home Medications    Prior to Admission medications   Medication Sig Start Date End Date Taking? Authorizing Provider  citalopram (CELEXA) 20 MG tablet Place 20 mg into feeding tube daily.   Yes Historical Provider, MD  esomeprazole (NEXIUM) 40 MG capsule Take 40 mg by mouth daily.    Yes Historical Provider, MD  losartan (COZAAR) 50 MG tablet Place 50 mg into feeding tube daily.   Yes Historical Provider, MD  Melatonin 10 MG TABS Place 10 mg into feeding tube at bedtime as needed (for sleep).   Yes Historical Provider, MD  metoprolol succinate (TOPROL-XL) 25 MG 24 hr tablet Take 25 mg by mouth daily.   Yes Historical Provider, MD  Multiple Vitamin (MULTIVITAMIN WITH MINERALS) TABS tablet Take 1 tablet by mouth daily.   Yes Historical Provider, MD  pravastatin (PRAVACHOL)  80 MG tablet Place 80 mg into feeding tube daily.   Yes Historical Provider, MD    Family History History reviewed. No pertinent family history.  Social History Social History  Substance Use Topics  . Smoking status: Former Games developer  . Smokeless tobacco: Never Used  . Alcohol use No     Allergies   Aspirin   Review of Systems Review of Systems  All other systems reviewed and are negative.   Physical Exam Updated Vital Signs BP 137/79 (BP Location: Left Arm)   Pulse (!) 55   Temp 97.8 F (36.6 C) (Oral)   Resp 18   SpO2 98%   Physical Exam  Constitutional: She is oriented to person, place, and time. She appears well-developed and well-nourished.  HENT:  Head: Normocephalic and atraumatic.  Eyes: Conjunctivae are normal. Pupils are equal, round, and reactive to light. Right eye exhibits no discharge. Left eye exhibits no discharge. No scleral icterus.  Neck: Normal range of motion. No JVD present. No tracheal deviation present.  Pulmonary/Chest: Effort normal. No stridor.  Abdominal:  G tube stoma atraumatic with no signs of infection. NTTP of site and surrounding abdomen   Neurological: She is alert and oriented to person, place, and time. Coordination normal.  Psychiatric: She has a normal mood and affect. Her behavior is normal. Judgment and thought content normal.  Nursing  note and vitals reviewed.    ED Treatments / Results  Labs (all labs ordered are listed, but only abnormal results are displayed) Labs Reviewed - No data to display  EKG  EKG Interpretation None       Radiology Dg Abdomen Peg Tube Location  Result Date: 01/04/2017 CLINICAL DATA:  Gastrostomy placement EXAM: ABDOMEN - 1 VIEW COMPARISON:  05/01/2006 FINDINGS: Gastrostomy projects over the midportion of the stomach. Contrast material injected through the gastrostomy fills the stomach. No evidence of extravasation. Large stool burden in the colon. No bowel obstruction, visible free air or  organomegaly. IMPRESSION: Gastrostomy appears to be within the mid stomach.  No extravasation. Large stool burden. Electronically Signed   By: Charlett NoseKevin  Dover M.D.   On: 01/04/2017 13:25    Procedures Procedures (including critical care time)  Gastrostomy tube replacement Performed by: Thermon LeylandHedges,Nataliee Shurtz Todd Consent: Verbal consent obtained. Risks and benefits: risks, benefits and alternatives were discussed Required items: required blood products, implants, devices, and special equipment available Patient identity confirmed: hospital-assigned identification number Time out: Immediately prior to procedure a "time out" was called to verify the correct patient, procedure, equipment, support staff and site/side marked as required. Preparation: Patient was prepped and draped in the usual sterile fashion. Patient tolerance: Patient tolerated the procedure well with no immediate complications.  Comments: 20 french Gastrostomy tube placed without difficulty  Medications Ordered in ED Medications  iopamidol (ISOVUE-300) 61 % injection (30 mLs Feeding Tube Contrast Given 01/04/17 1324)     Initial Impression / Assessment and Plan / ED Course  I have reviewed the triage vital signs and the nursing notes.  Pertinent labs & imaging results that were available during my care of the patient were reviewed by me and considered in my medical decision making (see chart for details).      Final Clinical Impressions(s) / ED Diagnoses   Final diagnoses:  Complication of gastrostomy tube [facility](HCC)    Labs:   Imaging: DG abdomen acute  Consults:  Therapeutics:  Discharge Meds:   Assessment/Plan: 71 year old female presents today with G-tube complication.  Nursing staff accidentally pulled out her G-tube at home.  This is an old site placed in 2007.  The surrounding site has no signs of infection or signs of trauma.  Using a 20 French tube I was able to place the tube.  Imaging studies confirm proper  placement.  Patient discharged with her daughter with return precautions.  No further questions or concerns at the time discharge.    New Prescriptions New Prescriptions   No medications on file     Eyvonne MechanicJeffrey Ginny Loomer, PA-C 01/04/17 1340    Maia PlanJoshua G Long, MD 01/04/17 2007

## 2017-01-04 NOTE — ED Triage Notes (Signed)
Pt accidentally pulled out feeding tube at 9am.  Has had tube opening since 2007

## 2017-01-04 NOTE — ED Notes (Signed)
Feeding tube, 20

## 2017-01-04 NOTE — Discharge Instructions (Signed)
Please read attached information. If you experience any new or worsening signs or symptoms please return to the emergency room for evaluation. Please follow-up with your primary care provider or specialist as discussed.  °

## 2017-03-09 DIAGNOSIS — E46 Unspecified protein-calorie malnutrition: Secondary | ICD-10-CM | POA: Diagnosis not present

## 2017-03-09 DIAGNOSIS — M62421 Contracture of muscle, right upper arm: Secondary | ICD-10-CM | POA: Diagnosis not present

## 2017-03-09 DIAGNOSIS — Z934 Other artificial openings of gastrointestinal tract status: Secondary | ICD-10-CM | POA: Diagnosis not present

## 2017-03-09 DIAGNOSIS — I10 Essential (primary) hypertension: Secondary | ICD-10-CM | POA: Diagnosis not present

## 2017-03-09 DIAGNOSIS — I6789 Other cerebrovascular disease: Secondary | ICD-10-CM | POA: Diagnosis not present

## 2017-03-09 DIAGNOSIS — Z993 Dependence on wheelchair: Secondary | ICD-10-CM | POA: Diagnosis not present

## 2017-03-09 DIAGNOSIS — E7800 Pure hypercholesterolemia, unspecified: Secondary | ICD-10-CM | POA: Diagnosis not present

## 2017-03-09 DIAGNOSIS — I69998 Other sequelae following unspecified cerebrovascular disease: Secondary | ICD-10-CM | POA: Diagnosis not present

## 2017-05-04 ENCOUNTER — Encounter (HOSPITAL_COMMUNITY): Payer: Self-pay

## 2017-05-04 ENCOUNTER — Emergency Department (HOSPITAL_COMMUNITY): Payer: Medicare Other

## 2017-05-04 ENCOUNTER — Emergency Department (HOSPITAL_COMMUNITY)
Admission: EM | Admit: 2017-05-04 | Discharge: 2017-05-04 | Disposition: A | Discharge status: Home / Self Care | Payer: Medicare Other | Attending: Emergency Medicine | Admitting: Emergency Medicine

## 2017-05-04 DIAGNOSIS — I639 Cerebral infarction, unspecified: Secondary | ICD-10-CM

## 2017-05-04 DIAGNOSIS — I10 Essential (primary) hypertension: Secondary | ICD-10-CM | POA: Diagnosis not present

## 2017-05-04 DIAGNOSIS — Z79899 Other long term (current) drug therapy: Secondary | ICD-10-CM | POA: Diagnosis not present

## 2017-05-04 DIAGNOSIS — Z8673 Personal history of transient ischemic attack (TIA), and cerebral infarction without residual deficits: Secondary | ICD-10-CM | POA: Insufficient documentation

## 2017-05-04 DIAGNOSIS — Z87891 Personal history of nicotine dependence: Secondary | ICD-10-CM | POA: Insufficient documentation

## 2017-05-04 DIAGNOSIS — K9423 Gastrostomy malfunction: Secondary | ICD-10-CM | POA: Diagnosis not present

## 2017-05-04 HISTORY — PX: IR REPLACE G-TUBE SIMPLE WO FLUORO: IMG2323

## 2017-05-04 MED ORDER — LIDOCAINE VISCOUS 2 % MT SOLN
OROMUCOSAL | Status: AC | PRN
  Administered 2017-05-04: 15 mL via OROMUCOSAL

## 2017-05-04 MED ORDER — LIDOCAINE VISCOUS 2 % MT SOLN
OROMUCOSAL | Status: AC
  Filled 2017-05-04: qty 15

## 2017-05-04 NOTE — Progress Notes (Signed)
Patient ID: Emma [name], female   DOB: [birth year], 71 y.o.   MRN: [id number] Pt had accidental dislodgement of perc G tube ; she underwent replacement with 20 french balloon retention tube today without difficulty; tube flushed and secured to skin. No immediate complications.

## 2017-05-04 NOTE — Progress Notes (Signed)
Pt family requesting assistance with feeding tube at home with previous use of [facility] but no current services. Family, primary RN, and MD notified the family would be called by [facility] after D/C.  No additional CM needs noted at this time.

## 2017-05-04 NOTE — ED Triage Notes (Signed)
BIB Family who state pts abd feeding tube dislodged earlier on 7/11. Sterile gauze applied to area. Family would like 22g Feeding Tube to be inserted.

## 2017-05-04 NOTE — ED Notes (Signed)
MD phone IR regarding procedure time-PA paged to EDP's phone-

## 2017-05-04 NOTE — ED Provider Notes (Signed)
Assumed care from Dr. Rhunette CroftNanavati at 7 AM. Briefly, the patient is a 71 y.o. female with PMHx of  has a past medical history of Brain aneurysm; Hypertension; and Stroke [facility](HCC). here with accidental dislodgement of G-Tube.   Labs Reviewed - No data to display  Course of Care: G-tube replaced by interventional radiology at the bedside. Patient tolerated the procedure well. I discussed with the family. They are frustrated with the wait in the emergency department but I discussed that the procedure was dependent on radiology availability. We discussed potential home treatments and I will contact case management with face-to-face filled out for home health and possible at home G-tube troubleshooting and attempt at replacement. Patient family is in agreement and thankful for this plan. Interventional radiology has also given the patient his information. Will discharge home.     Shaune PollackIsaacs, Milagros Middendorf, MD 05/04/17 [phone]

## 2017-05-04 NOTE — ED Provider Notes (Addendum)
[facility]-EMERGENCY DEPT Provider Note   CSN: [id number] Arrival date & time: 05/04/17  0201     History   Chief Complaint Chief Complaint  Patient presents with  . Feeding Tube Dislodged    HPI Emma [name] is a 71 y.o. female.  HPI Pt comes in with cc of G tube dislodgement. Pt here with family who provided information. LEVEL 5 CAVEAT FOR STROKE.  Pt's G tube was last seen normally around 9 pm. Pt brought to the ER at 2 am as family noted that the tube was dislodged. Pt was seen by me at 4:30 am. Family not sure how the tube fell out. Pt had a 20 Fr G tube placed in march in the ER when she had come in for dislodgement. Originally, pt had a 22 Fr feeding tube, but that fell out and the EDP placed a 20 Fr. Original tube was placed in [place].  Past Medical History:  Diagnosis Date  . Brain aneurysm   . Hypertension   . Stroke [facility])     There are no active problems to display for this patient.   Past Surgical History:  Procedure Laterality Date  . GASTROSTOMY W/ FEEDING TUBE    . SHUNT REPLACEMENT      OB History    No data available       Home Medications    Prior to Admission medications   Medication Sig Start Date End Date Taking? Authorizing Provider  citalopram (CELEXA) 20 MG tablet Place 20 mg into feeding tube daily.    [provider]  esomeprazole (NEXIUM) 40 MG capsule Take 40 mg by mouth daily.     [provider]  losartan (COZAAR) 50 MG tablet Place 50 mg into feeding tube daily.    [provider]  Melatonin 10 MG TABS Place 10 mg into feeding tube at bedtime as needed (for sleep).    [provider]  metoprolol succinate (TOPROL-XL) 25 MG 24 hr tablet Take 25 mg by mouth daily.    [provider]  Multiple Vitamin (MULTIVITAMIN WITH MINERALS) TABS tablet Take 1 tablet by mouth daily.    [provider]  pravastatin (PRAVACHOL) 80 MG tablet Place 80 mg into feeding tube daily.    [provider]    Family History History reviewed. No pertinent family history.  Social History Social History  Substance Use Topics  . Smoking status: Former Games developer  . Smokeless tobacco: Never Used  . Alcohol use No     Allergies   Aspirin   Review of Systems Review of Systems  Unable to perform ROS: Patient nonverbal     Physical Exam Updated Vital Signs BP (!) 123/93 (BP Location: Right Arm)   Pulse 78   Resp 18   SpO2 95%   Physical Exam  Constitutional: She appears well-developed.  HENT:  Head: Atraumatic.  Neck: Neck supple.  Cardiovascular: Normal rate.   Pulmonary/Chest: Effort normal.  Abdominal: Bowel sounds are normal.  Mild bleed at the G tube stoma site  Neurological: She is alert.  Skin: Skin is warm and dry.  Nursing note and vitals reviewed.    ED Treatments / Results  Labs (all labs ordered are listed, but only abnormal results are displayed) Labs Reviewed - No data to display  EKG  EKG Interpretation None       Radiology No results found.  Procedures Gastrostomy tube replacement Date/Time: 05/04/2017 4:53 AM Performed by: Derwood Kaplan Authorized by: Rhunette Croft,  Gradyn Shein  Consent: Verbal consent obtained. Risks and benefits: risks, benefits and alternatives were discussed Consent given by: power of attorney Patient understanding: patient states understanding of the procedure being performed Patient identity confirmed: arm band Time out: Immediately prior to procedure a "time out" was called to verify the correct patient, procedure, equipment, support staff and site/side marked as required. Preparation: Patient was prepped and draped in the usual sterile fashion. Local anesthesia used: no  Anesthesia: Local anesthesia used: no  Sedation: Patient sedated: no Patient tolerance: Patient tolerated the procedure well with no immediate complications Comments: 18 Fr. Foley catheter placed after we were unsuccessful with a 20 Fr G  tube placement    (including critical care time)  Medications Ordered in ED Medications - No data to display   Initial Impression / Assessment and Plan / ED Course  I have reviewed the triage vital signs and the nursing notes.  Pertinent labs & imaging results that were available during my care of the patient were reviewed by me and considered in my medical decision making (see chart for details).     Pt's G tube fell out last night. I was unable to cannulate 2- Ft G tube. We were able to pass 18 Fr. Foley catheter into the stoma to keep it patent. I called the Radiology team and they will alert the IR team. Pt last fed at night time. Not on any blood thinner, and no labs ordered. Dr. Erma HeritageIsaacs will follow.  Final Clinical Impressions(s) / ED Diagnoses   Final diagnoses:  Stroke [facility](HCC)  Gastrostomy tube dysfunction [facility](HCC)    New Prescriptions New Prescriptions   No medications on file     Derwood KaplanNanavati, Dominion Kathan, MD 05/04/17 [phone]    Derwood KaplanNanavati, Azaylia Fong, MD 05/04/17 [phone]

## 2017-05-04 NOTE — ED Notes (Signed)
Informed MD that IR states it would be several hours before they are able to perform procedure

## 2017-05-04 NOTE — Discharge Instructions (Signed)
-   Call Dr. Wylene Simmerisovec to see if he can order a replacement tube for you at home

## 2017-05-08 ENCOUNTER — Other Ambulatory Visit (HOSPITAL_COMMUNITY): Payer: Self-pay | Admitting: Interventional Radiology

## 2017-05-08 DIAGNOSIS — I10 Essential (primary) hypertension: Secondary | ICD-10-CM | POA: Diagnosis not present

## 2017-05-08 DIAGNOSIS — R633 Feeding difficulties, unspecified: Secondary | ICD-10-CM

## 2017-05-08 DIAGNOSIS — Z8673 Personal history of transient ischemic attack (TIA), and cerebral infarction without residual deficits: Secondary | ICD-10-CM | POA: Diagnosis not present

## 2017-05-08 DIAGNOSIS — Z431 Encounter for attention to gastrostomy: Secondary | ICD-10-CM | POA: Diagnosis not present

## 2017-05-15 DIAGNOSIS — Z8673 Personal history of transient ischemic attack (TIA), and cerebral infarction without residual deficits: Secondary | ICD-10-CM | POA: Diagnosis not present

## 2017-05-15 DIAGNOSIS — I10 Essential (primary) hypertension: Secondary | ICD-10-CM | POA: Diagnosis not present

## 2017-05-15 DIAGNOSIS — Z431 Encounter for attention to gastrostomy: Secondary | ICD-10-CM | POA: Diagnosis not present

## 2017-05-16 ENCOUNTER — Telehealth: Payer: Self-pay | Admitting: Student

## 2017-05-16 DIAGNOSIS — Z431 Encounter for attention to gastrostomy: Secondary | ICD-10-CM | POA: Diagnosis not present

## 2017-05-16 DIAGNOSIS — Z8673 Personal history of transient ischemic attack (TIA), and cerebral infarction without residual deficits: Secondary | ICD-10-CM | POA: Diagnosis not present

## 2017-05-16 DIAGNOSIS — I10 Essential (primary) hypertension: Secondary | ICD-10-CM | POA: Diagnosis not present

## 2017-05-16 NOTE — Telephone Encounter (Signed)
Message received from office staff re: Emma [name] and issues with feeding tube since last tube exchange 05/04/17.  I called but no answer and no VM.   [name], MS RD PA-C 5:50 PM

## 2017-05-23 DIAGNOSIS — I10 Essential (primary) hypertension: Secondary | ICD-10-CM | POA: Diagnosis not present

## 2017-05-23 DIAGNOSIS — Z8673 Personal history of transient ischemic attack (TIA), and cerebral infarction without residual deficits: Secondary | ICD-10-CM | POA: Diagnosis not present

## 2017-05-23 DIAGNOSIS — Z431 Encounter for attention to gastrostomy: Secondary | ICD-10-CM | POA: Diagnosis not present

## 2017-06-01 DIAGNOSIS — Z431 Encounter for attention to gastrostomy: Secondary | ICD-10-CM | POA: Diagnosis not present

## 2017-06-01 DIAGNOSIS — Z8673 Personal history of transient ischemic attack (TIA), and cerebral infarction without residual deficits: Secondary | ICD-10-CM | POA: Diagnosis not present

## 2017-06-01 DIAGNOSIS — I10 Essential (primary) hypertension: Secondary | ICD-10-CM | POA: Diagnosis not present

## 2017-06-08 DIAGNOSIS — Z8673 Personal history of transient ischemic attack (TIA), and cerebral infarction without residual deficits: Secondary | ICD-10-CM | POA: Diagnosis not present

## 2017-06-08 DIAGNOSIS — Z431 Encounter for attention to gastrostomy: Secondary | ICD-10-CM | POA: Diagnosis not present

## 2017-06-08 DIAGNOSIS — I10 Essential (primary) hypertension: Secondary | ICD-10-CM | POA: Diagnosis not present

## 2017-06-15 DIAGNOSIS — I10 Essential (primary) hypertension: Secondary | ICD-10-CM | POA: Diagnosis not present

## 2017-06-15 DIAGNOSIS — Z8673 Personal history of transient ischemic attack (TIA), and cerebral infarction without residual deficits: Secondary | ICD-10-CM | POA: Diagnosis not present

## 2017-06-15 DIAGNOSIS — Z431 Encounter for attention to gastrostomy: Secondary | ICD-10-CM | POA: Diagnosis not present

## 2017-09-18 DIAGNOSIS — E7800 Pure hypercholesterolemia, unspecified: Secondary | ICD-10-CM | POA: Diagnosis not present

## 2017-09-18 DIAGNOSIS — M25571 Pain in right ankle and joints of right foot: Secondary | ICD-10-CM | POA: Diagnosis not present

## 2017-09-18 DIAGNOSIS — Z993 Dependence on wheelchair: Secondary | ICD-10-CM | POA: Diagnosis not present

## 2017-09-18 DIAGNOSIS — E441 Mild protein-calorie malnutrition: Secondary | ICD-10-CM | POA: Diagnosis not present

## 2017-09-18 DIAGNOSIS — M62421 Contracture of muscle, right upper arm: Secondary | ICD-10-CM | POA: Diagnosis not present

## 2017-09-18 DIAGNOSIS — I10 Essential (primary) hypertension: Secondary | ICD-10-CM | POA: Diagnosis not present

## 2017-09-18 DIAGNOSIS — I69998 Other sequelae following unspecified cerebrovascular disease: Secondary | ICD-10-CM | POA: Diagnosis not present

## 2017-09-18 DIAGNOSIS — Z934 Other artificial openings of gastrointestinal tract status: Secondary | ICD-10-CM | POA: Diagnosis not present

## 2017-10-19 ENCOUNTER — Other Ambulatory Visit (HOSPITAL_COMMUNITY): Payer: Medicare Other

## 2017-10-25 ENCOUNTER — Encounter (HOSPITAL_COMMUNITY): Payer: Self-pay | Admitting: Interventional Radiology

## 2017-10-25 ENCOUNTER — Other Ambulatory Visit (HOSPITAL_COMMUNITY): Payer: Self-pay | Admitting: Interventional Radiology

## 2017-10-25 ENCOUNTER — Ambulatory Visit (HOSPITAL_COMMUNITY)
Admission: RE | Admit: 2017-10-25 | Discharge: 2017-10-25 | Disposition: A | Discharge status: Home / Self Care | Payer: Medicare Other | Source: Ambulatory Visit | Attending: Interventional Radiology | Admitting: Interventional Radiology

## 2017-10-25 DIAGNOSIS — K9423 Gastrostomy malfunction: Secondary | ICD-10-CM | POA: Diagnosis not present

## 2017-10-25 DIAGNOSIS — Z431 Encounter for attention to gastrostomy: Secondary | ICD-10-CM | POA: Insufficient documentation

## 2017-10-25 DIAGNOSIS — R633 Feeding difficulties, unspecified: Secondary | ICD-10-CM

## 2017-10-25 HISTORY — PX: IR REPLACE G-TUBE SIMPLE WO FLUORO: IMG2323

## 2017-11-09 ENCOUNTER — Other Ambulatory Visit: Payer: Self-pay

## 2017-11-09 ENCOUNTER — Encounter (HOSPITAL_COMMUNITY): Payer: Self-pay | Admitting: Emergency Medicine

## 2017-11-09 ENCOUNTER — Emergency Department (HOSPITAL_COMMUNITY): Payer: Medicare Other

## 2017-11-09 ENCOUNTER — Inpatient Hospital Stay (HOSPITAL_COMMUNITY)
Admission: EM | Admit: 2017-11-09 | Discharge: 2017-11-24 | DRG: 871 | Disposition: E | Payer: Medicare Other | Attending: Emergency Medicine | Admitting: Emergency Medicine

## 2017-11-09 DIAGNOSIS — I959 Hypotension, unspecified: Secondary | ICD-10-CM | POA: Diagnosis present

## 2017-11-09 DIAGNOSIS — Z8679 Personal history of other diseases of the circulatory system: Secondary | ICD-10-CM

## 2017-11-09 DIAGNOSIS — R4702 Dysphasia: Secondary | ICD-10-CM | POA: Diagnosis present

## 2017-11-09 DIAGNOSIS — G253 Myoclonus: Secondary | ICD-10-CM | POA: Diagnosis present

## 2017-11-09 DIAGNOSIS — D638 Anemia in other chronic diseases classified elsewhere: Secondary | ICD-10-CM | POA: Diagnosis present

## 2017-11-09 DIAGNOSIS — E872 Acidosis: Secondary | ICD-10-CM | POA: Diagnosis present

## 2017-11-09 DIAGNOSIS — B9689 Other specified bacterial agents as the cause of diseases classified elsewhere: Secondary | ICD-10-CM | POA: Diagnosis present

## 2017-11-09 DIAGNOSIS — I214 Non-ST elevation (NSTEMI) myocardial infarction: Secondary | ICD-10-CM | POA: Diagnosis present

## 2017-11-09 DIAGNOSIS — E876 Hypokalemia: Secondary | ICD-10-CM | POA: Diagnosis present

## 2017-11-09 DIAGNOSIS — Z01818 Encounter for other preprocedural examination: Secondary | ICD-10-CM | POA: Diagnosis not present

## 2017-11-09 DIAGNOSIS — I69320 Aphasia following cerebral infarction: Secondary | ICD-10-CM

## 2017-11-09 DIAGNOSIS — D696 Thrombocytopenia, unspecified: Secondary | ICD-10-CM | POA: Diagnosis present

## 2017-11-09 DIAGNOSIS — R739 Hyperglycemia, unspecified: Secondary | ICD-10-CM | POA: Diagnosis present

## 2017-11-09 DIAGNOSIS — J690 Pneumonitis due to inhalation of food and vomit: Secondary | ICD-10-CM | POA: Diagnosis present

## 2017-11-09 DIAGNOSIS — Z982 Presence of cerebrospinal fluid drainage device: Secondary | ICD-10-CM | POA: Diagnosis not present

## 2017-11-09 DIAGNOSIS — I10 Essential (primary) hypertension: Secondary | ICD-10-CM | POA: Diagnosis present

## 2017-11-09 DIAGNOSIS — R570 Cardiogenic shock: Secondary | ICD-10-CM | POA: Diagnosis present

## 2017-11-09 DIAGNOSIS — I69359 Hemiplegia and hemiparesis following cerebral infarction affecting unspecified side: Secondary | ICD-10-CM

## 2017-11-09 DIAGNOSIS — K7200 Acute and subacute hepatic failure without coma: Secondary | ICD-10-CM | POA: Diagnosis present

## 2017-11-09 DIAGNOSIS — E162 Hypoglycemia, unspecified: Secondary | ICD-10-CM | POA: Diagnosis present

## 2017-11-09 DIAGNOSIS — J9811 Atelectasis: Secondary | ICD-10-CM | POA: Diagnosis not present

## 2017-11-09 DIAGNOSIS — G931 Anoxic brain damage, not elsewhere classified: Secondary | ICD-10-CM | POA: Diagnosis present

## 2017-11-09 DIAGNOSIS — J811 Chronic pulmonary edema: Secondary | ICD-10-CM | POA: Diagnosis not present

## 2017-11-09 DIAGNOSIS — J9601 Acute respiratory failure with hypoxia: Secondary | ICD-10-CM | POA: Diagnosis present

## 2017-11-09 DIAGNOSIS — Z87891 Personal history of nicotine dependence: Secondary | ICD-10-CM

## 2017-11-09 DIAGNOSIS — I469 Cardiac arrest, cause unspecified: Secondary | ICD-10-CM | POA: Diagnosis present

## 2017-11-09 DIAGNOSIS — R6521 Severe sepsis with septic shock: Secondary | ICD-10-CM | POA: Diagnosis present

## 2017-11-09 DIAGNOSIS — Z931 Gastrostomy status: Secondary | ICD-10-CM | POA: Diagnosis not present

## 2017-11-09 DIAGNOSIS — A4151 Sepsis due to Escherichia coli [E. coli]: Secondary | ICD-10-CM | POA: Diagnosis not present

## 2017-11-09 DIAGNOSIS — N179 Acute kidney failure, unspecified: Secondary | ICD-10-CM | POA: Diagnosis present

## 2017-11-09 DIAGNOSIS — N390 Urinary tract infection, site not specified: Secondary | ICD-10-CM | POA: Diagnosis present

## 2017-11-09 DIAGNOSIS — Z888 Allergy status to other drugs, medicaments and biological substances status: Secondary | ICD-10-CM

## 2017-11-09 DIAGNOSIS — Z9911 Dependence on respirator [ventilator] status: Secondary | ICD-10-CM | POA: Diagnosis not present

## 2017-11-09 DIAGNOSIS — R4182 Altered mental status, unspecified: Secondary | ICD-10-CM | POA: Diagnosis not present

## 2017-11-09 DIAGNOSIS — R4020 Unspecified coma: Secondary | ICD-10-CM | POA: Diagnosis not present

## 2017-11-09 LAB — COMPREHENSIVE METABOLIC PANEL
ALBUMIN: 1.8 g/dL — AB (ref 3.5–5.0)
ALT: 114 U/L — AB (ref 14–54)
AST: 161 U/L — ABNORMAL HIGH (ref 15–41)
Alkaline Phosphatase: 385 U/L — ABNORMAL HIGH (ref 38–126)
Anion gap: 19 — ABNORMAL HIGH (ref 5–15)
BUN: 20 mg/dL (ref 6–20)
CHLORIDE: 94 mmol/L — AB (ref 101–111)
CO2: 16 mmol/L — AB (ref 22–32)
CREATININE: 1.11 mg/dL — AB (ref 0.44–1.00)
Calcium: 9.8 mg/dL (ref 8.9–10.3)
GFR calc non Af Amer: 48 mL/min — ABNORMAL LOW (ref >60)
GFR, EST AFRICAN AMERICAN: 56 mL/min — AB (ref >60)
GLUCOSE: 412 mg/dL — AB (ref 65–99)
Potassium: 2.7 mmol/L — CL (ref 3.5–5.1)
SODIUM: 129 mmol/L — AB (ref 135–145)
Total Bilirubin: 2.2 mg/dL — ABNORMAL HIGH (ref 0.3–1.2)
Total Protein: 4.5 g/dL — ABNORMAL LOW (ref 6.5–8.1)

## 2017-11-09 LAB — BLOOD GAS, ARTERIAL
Acid-base deficit: 18.4 mmol/L — ABNORMAL HIGH (ref 0.0–2.0)
Bicarbonate: 10.3 mmol/L — ABNORMAL LOW (ref 20.0–28.0)
Drawn by: 257881
FIO2: 100
O2 SAT: 98.4 %
PATIENT TEMPERATURE: 98.6
PCO2 ART: 35.1 mmHg (ref 32.0–48.0)
PEEP/CPAP: 5 cmH2O
PH ART: 7.094 — AB (ref 7.350–7.450)
RATE: 14 resp/min
VT: 400 mL
pO2, Arterial: 288 mmHg — ABNORMAL HIGH (ref 83.0–108.0)

## 2017-11-09 LAB — URINALYSIS, ROUTINE W REFLEX MICROSCOPIC
Bilirubin Urine: NEGATIVE
Glucose, UA: 50 mg/dL — AB
Ketones, ur: NEGATIVE mg/dL
NITRITE: NEGATIVE
Protein, ur: 100 mg/dL — AB
SPECIFIC GRAVITY, URINE: 1.009 (ref 1.005–1.030)
pH: 6 (ref 5.0–8.0)

## 2017-11-09 LAB — GLUCOSE, CAPILLARY: GLUCOSE-CAPILLARY: 86 mg/dL (ref 65–99)

## 2017-11-09 LAB — I-STAT CG4 LACTIC ACID, ED: Lactic Acid, Venous: >17 mmol/L (ref 0.5–1.9)

## 2017-11-09 LAB — I-STAT TROPONIN, ED: Troponin i, poc: 0.31 ng/mL (ref 0.00–0.08)

## 2017-11-09 LAB — MRSA PCR SCREENING: MRSA by PCR: NEGATIVE

## 2017-11-09 LAB — AMMONIA: Ammonia: 72 umol/L — ABNORMAL HIGH (ref 9–35)

## 2017-11-09 MED ORDER — MIDAZOLAM HCL 2 MG/2ML IJ SOLN: 1.0000 mg | INTRAMUSCULAR | Status: DC | PRN

## 2017-11-09 MED ORDER — NOREPINEPHRINE BITARTRATE 1 MG/ML IV SOLN
0.0000 ug/min | Freq: Once | INTRAVENOUS | Status: AC
  Administered 2017-11-09: 2 ug/min via INTRAVENOUS
  Filled 2017-11-09: qty 4

## 2017-11-09 MED ORDER — FAMOTIDINE IN NACL 20-0.9 MG/50ML-% IV SOLN
20.0000 mg | Freq: Two times a day (BID) | INTRAVENOUS | Status: DC
  Administered 2017-11-10: 20 mg via INTRAVENOUS
  Filled 2017-11-09: qty 50

## 2017-11-09 MED ORDER — EPINEPHRINE PF 1 MG/ML IJ SOLN
0.5000 ug/min | INTRAVENOUS | Status: DC
  Administered 2017-11-09: 9 ug/min via INTRAVENOUS
  Administered 2017-11-09: 0.5 ug/min via INTRAVENOUS
  Administered 2017-11-09: 10 ug/min via INTRAVENOUS
  Administered 2017-11-10: 12 ug/min via INTRAVENOUS
  Filled 2017-11-09 (×3): qty 4

## 2017-11-09 MED ORDER — MIDAZOLAM HCL 2 MG/2ML IJ SOLN
2.0000 mg | Freq: Once | INTRAMUSCULAR | Status: AC
  Administered 2017-11-09: 2 mg via INTRAVENOUS
  Filled 2017-11-09: qty 2

## 2017-11-09 MED ORDER — EPINEPHRINE PF 1 MG/ML IJ SOLN
0.5000 ug/min | INTRAVENOUS | Status: DC
  Filled 2017-11-09: qty 4

## 2017-11-09 MED ORDER — SODIUM CHLORIDE 0.9 % IV SOLN
1.5000 g | Freq: Four times a day (QID) | INTRAVENOUS | Status: DC
  Administered 2017-11-10: 1.5 g via INTRAVENOUS
  Filled 2017-11-09 (×4): qty 1.5

## 2017-11-09 MED ORDER — NOREPINEPHRINE BITARTRATE 1 MG/ML IV SOLN: 0.0000 ug/min | INTRAVENOUS | Status: DC

## 2017-11-09 MED ORDER — DEXTROSE 5 % IV SOLN
0.0000 ug/min | INTRAVENOUS | Status: DC
  Administered 2017-11-09: 30 ug/min via INTRAVENOUS
  Administered 2017-11-10: 29 ug/min via INTRAVENOUS
  Filled 2017-11-09 (×2): qty 16

## 2017-11-09 MED ORDER — DEXTROSE 50 % IV SOLN
INTRAVENOUS | Status: AC | PRN
  Administered 2017-11-09 (×2): 1 via INTRAVENOUS

## 2017-11-09 MED ORDER — EPINEPHRINE PF 1 MG/ML IJ SOLN
INTRAMUSCULAR | Status: AC
  Filled 2017-11-09: qty 1

## 2017-11-09 MED ORDER — SODIUM CHLORIDE 0.9 % IV SOLN
250.0000 mL | INTRAVENOUS | Status: DC | PRN
Start: 2017-11-09 — End: 2017-11-10

## 2017-11-09 MED ORDER — FENTANYL CITRATE (PF) 100 MCG/2ML IJ SOLN: 50.0000 ug | INTRAMUSCULAR | Status: DC | PRN

## 2017-11-09 MED ORDER — INSULIN ASPART 100 UNIT/ML ~~LOC~~ SOLN: 2.0000 [IU] | SUBCUTANEOUS | Status: DC

## 2017-11-09 MED ORDER — DEXTROSE 50 % IV SOLN
INTRAVENOUS | Status: AC
  Filled 2017-11-09: qty 50

## 2017-11-09 MED ORDER — SODIUM CHLORIDE 0.9 % IV SOLN
1.5000 g | Freq: Once | INTRAVENOUS | Status: DC
  Filled 2017-11-09: qty 1.5

## 2017-11-09 MED ORDER — SODIUM CHLORIDE 0.9 % IV SOLN
INTRAVENOUS | Status: DC
  Administered 2017-11-09: 23:00:00 via INTRAVENOUS

## 2017-11-09 MED ORDER — SODIUM CHLORIDE 0.9 % IV SOLN
INTRAVENOUS | Status: AC | PRN
  Administered 2017-11-09: 1000 mL via INTRAVENOUS

## 2017-11-09 MED ORDER — EPINEPHRINE PF 1 MG/10ML IJ SOSY
PREFILLED_SYRINGE | INTRAMUSCULAR | Status: AC | PRN
  Administered 2017-11-09: 1 via INTRAVENOUS
  Administered 2017-11-09: 1 mg via INTRAVENOUS

## 2017-11-09 NOTE — ED Notes (Signed)
Chaplain paged by nursing to report [facility].   Providing support with pt's son in law Marcial Pacas(Timothy)  in ED.  Pt's daughter is HCPOA.  She is currently in [place].  Pt's has sons who are 4 hours away.    Son in Social workerlaw serving as primary contact with family.

## 2017-11-09 NOTE — Progress Notes (Signed)
Chaplain notified by Staff Chaplain of the patient's admission to the ED, is a [facility] whose medical status is now critical. Her son in law is present at the bedside, and reports his wife is the patient's Avon GullyHCPOA is in route home from [place] from a business trip. Chaplain provided emotional support, and comfort measures for the son in law, as he provides the ministry of presence for his mother in law. He is appreciative of the support, and plans to remain at the patient's bedside as he states a family member is always present with her. Chaplain will follow up as needed. Chaplain Janell Quietudrey Shaneil Yazdi [phone]

## 2017-11-09 NOTE — ED Provider Notes (Signed)
[facility]-EMERGENCY DEPT Provider Note   CSN: [id number] Arrival date & time: 11/26/2017  1606     History   Chief Complaint Chief Complaint  Patient presents with  . Unresponsive  . CPR    HPI Emma [name] is a 72 y.o. female.  HPI  Patient 72 year old female that came in unresponsive.  Past medical history significant for stroke, chronic G-tube, brain aneurysm with craniotomy contracture patient was pulseless on arrival.  CPR initiated.  Patient was brought in by son-in-law.  5 caveat altered mental status.   Past Medical History:  Diagnosis Date  . Brain aneurysm   . Hypertension   . Stroke [facility])     There are no active problems to display for this patient.   Past Surgical History:  Procedure Laterality Date  . GASTROSTOMY W/ FEEDING TUBE    . IR REPLACE G-TUBE SIMPLE WO FLUORO  05/04/2017  . IR REPLACE G-TUBE SIMPLE WO FLUORO  10/25/2017  . SHUNT REPLACEMENT      OB History    No data available       Home Medications    Prior to Admission medications   Medication Sig Start Date End Date Taking? Authorizing Provider  citalopram (CELEXA) 20 MG tablet Place 20 mg into feeding tube daily.    [provider]  esomeprazole (NEXIUM) 40 MG capsule Take 40 mg by mouth daily.     [provider]  losartan (COZAAR) 50 MG tablet Place 50 mg into feeding tube daily.    [provider]  Melatonin 10 MG TABS Place 10 mg into feeding tube at bedtime as needed (for sleep).    [provider]  metoprolol succinate (TOPROL-XL) 25 MG 24 hr tablet Take 25 mg by mouth daily.    [provider]  Multiple Vitamin (MULTIVITAMIN WITH MINERALS) TABS tablet Take 1 tablet by mouth daily.    [provider]  pravastatin (PRAVACHOL) 80 MG tablet Place 80 mg into feeding tube daily.    [provider]    Family History No family history on file.  Social History Social History   Tobacco Use  .  Smoking status: Former Games developer  . Smokeless tobacco: Never Used  Substance Use Topics  . Alcohol use: No  . Drug use: No     Allergies   Aspirin   Review of Systems Review of Systems  Unable to perform ROS: Intubated     Physical Exam Updated Vital Signs Ht 5\' 1"  (1.549 m)   SpO2 100%   Physical Exam  Constitutional: She is oriented to person, place, and time. She appears well-developed and well-nourished.  HENT:  Head: Normocephalic and atraumatic.  Eyes: Right eye exhibits no discharge.  Cardiovascular: Normal rate, regular rhythm and normal heart sounds.  No murmur heard. Pulmonary/Chest: Effort normal and breath sounds normal. She has no wheezes. She has no rales.  Abdominal: Soft. She exhibits no distension. There is no tenderness.  Neurological: She is oriented to person, place, and time.  Skin: Skin is warm and dry. She is not diaphoretic.  Psychiatric: She has a normal mood and affect.  Nursing note and vitals reviewed.    ED Treatments / Results  Labs (all labs ordered are listed, but only abnormal results are displayed) Labs Reviewed  I-STAT CG4 LACTIC ACID, ED - Abnormal; Notable for the following components:      Result Value   Lactic Acid, Venous >17.00 (*)    All other components  within normal limits  I-STAT TROPONIN, ED - Abnormal; Notable for the following components:   Troponin i, poc 0.31 (*)    All other components within normal limits  COMPREHENSIVE METABOLIC PANEL  CBC WITH DIFFERENTIAL/PLATELET  AMMONIA  URINALYSIS, ROUTINE W REFLEX MICROSCOPIC  I-STAT VENOUS BLOOD GAS, ED    EKG  EKG Interpretation None       Radiology Dg Chest Port 1 View  Result Date: 11/02/2017 CLINICAL DATA:  ETT placement EXAM: PORTABLE CHEST 1 VIEW COMPARISON:  02/04/2006 FINDINGS: Endotracheal tube tip overlies the right mainstem bronchus. Low lung volumes. Atelectasis at the right greater than left base. Stable cardiomediastinal silhouette. No  pneumothorax. Right-sided shunt tubing. Aortic atherosclerosis. IMPRESSION: Tip of the endotracheal tube overlies the proximal right mainstem bronchus Low lung volumes with basilar atelectasis Critical Value/emergent results were called by telephone at the time of interpretation on 11/02/2017 at 4:55 pm to Dr. Bary CastillaOURTENEY Syniyah Bourne , who verbally acknowledged these results. Electronically Signed   By: Jasmine PangKim  Fujinaga M.D.   On: 10/25/2017 16:55    Procedures Procedures (including critical care time)  CRITICAL CARE Performed by: Arlana Hoveourteney L Pearse Shiffler Total critical care time: 75 minutes Critical care time was exclusive of separately billable procedures and treating other patients. Critical care was necessary to treat or prevent imminent or life-threatening deterioration. Critical care was time spent personally by me on the following activities: development of treatment plan with patient and/or surrogate as well as nursing, discussions with consultants, evaluation of patient's response to treatment, examination of patient, obtaining history from patient or surrogate, ordering and performing treatments and interventions, ordering and review of laboratory studies, ordering and review of radiographic studies, pulse oximetry and re-evaluation of patient's condition.   Medications Ordered in ED Medications  EPINEPHrine (ADRENALIN) 4 mg in dextrose 5 % 250 mL (0.016 mg/mL) infusion (0.5 mcg/min Intravenous New Bag/Given 11/12/2017 1654)  EPINEPHrine (ADRENALIN) 1 MG/ML (not administered)  norepinephrine (LEVOPHED) 4 mg in dextrose 5 % 250 mL (0.016 mg/mL) infusion (2 mcg/min Intravenous New Bag/Given 11/23/2017 1656)     Initial Impression / Assessment and Plan / ED Course  I have reviewed the triage vital signs and the nursing notes.  Pertinent labs & imaging results that were available during my care of the patient were reviewed by me and considered in my medical decision making (see chart for details).       Patient 72 year old female that came in unresponsive.  Past medical history significant for stroke, chronic G-tube, brain aneurysm with craniotomy contracture patient was pulseless on arrival.  CPR initiated.  Patient was brought in by son-in-law.  5 caveat altered mental status.  5:23 PM   Had CPR for performed for 25 minutes.  Patient found to have glucose below 66 after D50.  Additional D50 given.  Calcium given.  Epi x3.  After push of D50, patient had spontaneous return of circulation.    I think that the arrest is likely precipitated by hypoglycemia.  Had long discussion with son-in-law, and daughter.  Daughter is in [place].  No decision made to make her DNR from this point forward.  Both Levophed and epi drips are running.  Given history of aneurysm, CT head stat ordered top be done before moving to ICU.   Final Clinical Impressions(s) / ED Diagnoses   Final diagnoses:  Encounter for intubation    ED Discharge Orders    None       Abelino DerrickMackuen, Rose-Marie Hickling Lyn, MD 2018/04/10 1647

## 2017-11-09 NOTE — ED Notes (Signed)
EDP MACKUEN AT BEDSIDE. VERBAL ORDER TO SET MEDICATION LEVO AT CURRENT RATE. PERFORMED AS ORDERED

## 2017-11-09 NOTE — ED Notes (Signed)
1 amp of calcium gluconate administered through IO line.  Not available under one step medications

## 2017-11-09 NOTE — ED Triage Notes (Signed)
Pt brought in by son unresponsive.  Pt brought immediately back to Res B where no pulse was found. CPR started immediately.  See documentation for CPR record.

## 2017-11-09 NOTE — H&P (Signed)
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  She is cared for by her family with 24-hour care assistance.  History is given by her son-in-law.  She had been well until last week when she experienced some emesis.  This seemed to resolve and has since been tolerating tube feeding.  On the date of admission at home he noted that she had a rapid shallow breathing pattern.  He went to interact with her and instead of being able to squeeze his hand (which she can typically do) she was only able to reach out with tremor.  No documented fevers, other new symptoms.  Question whether the tremor reflected a chill.  He brought her to the emergency department for evaluation but notes that just as they were pulling up to the department she became lethargic, probably stopped breathing.  She was brought immediately into the emergency department and CPR was initiated.  She achieved [facility] at approximately 25 minutes.  She achieved a blood pressure that could be measured on epinephrine 14, norepinephrine 30.  Discussions were undertaken with the patient's daughter by phone by Dr. Corlis Leak.  She confirmed that given the course and the lack of benefit that no more CPR should be undertaken should her mother decompensate.  She will be admitted to the ICU  PAST MEDICAL HISTORY :  She  has a past medical history of Brain aneurysm, Hypertension, and Stroke (HCC).  PAST SURGICAL HISTORY: She  has a past surgical history that includes Gastrostomy w/ feeding tube; Shunt replacement; IR REPLACE G-TUBE SIMPLE WO FLUORO (05/04/2017); and IR REPLACE G-TUBE SIMPLE WO FLUORO (10/25/2017).  Allergies  Allergen Reactions  .  Aspirin     Unknown reaction     No current facility-administered medications on file prior to encounter.    Current Outpatient Medications on File Prior to Encounter  Medication Sig  . citalopram (CELEXA) 20 MG tablet Place 20 mg into feeding tube daily.  . diphenhydrAMINE (BENADRYL) 12.5 MG/5ML liquid Place 25 mg into feeding tube every evening.   Marland Kitchen esomeprazole (NEXIUM) 40 MG capsule Take 40 mg by mouth daily after breakfast.   . losartan (COZAAR) 50 MG tablet Place 50 mg into feeding tube daily after breakfast.   . metoprolol succinate (TOPROL-XL) 25 MG 24 hr tablet Take 25 mg by mouth daily.  . Multiple Vitamin (MULTIVITAMIN WITH MINERALS) TABS tablet Take 1 tablet by mouth daily after breakfast.   . pravastatin (PRAVACHOL) 80 MG tablet Place 80 mg into feeding tube every evening.     FAMILY HISTORY:  Her has no family status information on file.    SOCIAL HISTORY: She  reports that she has quit smoking. she has never used smokeless tobacco. She reports that she does not drink alcohol or use drugs.  REVIEW OF SYSTEMS:   Unable to obtain  SUBJECTIVE:  Unable to obtain  VITAL SIGNS: BP 115/73   Pulse (!) 112   Temp 99.1 F (37.3 C)   Resp 19   Ht 5\' 1"  (1.549 m)   SpO2 100%   HEMODYNAMICS:    VENTILATOR SETTINGS: FiO2 (%):  [60 %-100 %] 60 % Set Rate:  [14 bmp-24 bmp]  24 bmp Vt Set:  [400 mL] 400 mL PEEP:  [5 cmH20] 5 cmH20 Plateau Pressure:  [22 cmH20] 22 cmH20  INTAKE / OUTPUT: No intake/output data recorded.  PHYSICAL EXAMINATION: General:  Ill appearing elderly woman, intubated and ventilated.  Neuro:  Unresponsive, some myoclonic jerks when stimulated. Intact cough. Intact spontaneous resp drive HEENT:  ETT in place, some clear secretions. Pupils pinpoint B, not reactive.   Cardiovascular:  Regular, no M heard Lungs:  Coarse BS bilaterally Abdomen:  Somewhat firm, PEG site CDI Musculoskeletal: UE contracture Skin:  No rash or lesions.    LABS:  BMET Recent Labs  Lab 11/01/2017 1627  NA 129*  K 2.7*  CL 94*  CO2 16*  BUN 20  CREATININE 1.11*  GLUCOSE 412*    Electrolytes Recent Labs  Lab 11/05/2017 1627  CALCIUM 9.8    CBC Recent Labs  Lab 11/03/2017 1627  WBC 16.6*  HGB 9.1*  HCT 28.3*  PLT 110*    Coag's No results for input(s): APTT, INR in the last 168 hours.  Sepsis Markers Recent Labs  Lab 11/03/2017 1637  LATICACIDVEN >17.00*    ABG Recent Labs  Lab 10/29/2017 1745  PHART 7.094*  PCO2ART 35.1  PO2ART 288*    Liver Enzymes Recent Labs  Lab 11/23/2017 1627  AST 161*  ALT 114*  ALKPHOS 385*  BILITOT 2.2*  ALBUMIN 1.8*    Cardiac Enzymes No results for input(s): TROPONINI, PROBNP in the last 168 hours.  Glucose No results for input(s): GLUCAP in the last 168 hours.  Imaging Ct Head Wo Contrast  Result Date: 10/29/2017 CLINICAL DATA:  Altered mental status.  History of brain aneurysm. EXAM: CT HEAD WITHOUT CONTRAST TECHNIQUE: Contiguous axial images were obtained from the base of the skull through the vertex without intravenous contrast. COMPARISON:  CT HEAD April 19, 2006 FINDINGS: BRAIN: No intraparenchymal hemorrhage, mass effect, midline shift or acute large vascular territory infarcts. LEFT cerebral atrophy with ex vacuo dilatation subjacent cyst/porencephalic. Ventriculoperitoneal shunt via RIGHT parietal burr hole, distal tip in frontal horn RIGHT lateral ventricle. No hydrocephalus. Small area RIGHT frontal lobe encephalomalacia likely reflecting old catheter track considering subjacent location 2 burr hole. No abnormal extra-axial fluid collections. Basal cisterns are patent. VASCULAR: Status post clipping LEFT MCA aneurysm. SKULL: No skull fracture. Old LEFT frontal craniotomy and RIGHT frontal burr hole. Severe temporomandibular osteoarthrosis. No significant scalp soft tissue swelling. SINUSES/ORBITS: The mastoid air-cells and included paranasal sinuses are well-aerated.The  included ocular globes and orbital contents are non-suspicious. OTHER: Life-support lines in place. IMPRESSION: 1. No acute intracranial process. 2. RIGHT ventriculoperitoneal shunt, no hydrocephalus. 3. 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 Status post LEFT MCA aneurysm clipping. 4. RIGHT frontal lobe encephalomalacia, likely along prior catheter tract. Electronically Signed   By: Awilda Metro M.D.   On: 11/17/2017 18:28   Dg Chest Port 1 View  Result Date: 11/12/2017 CLINICAL DATA:  ETT placement EXAM: PORTABLE CHEST 1 VIEW COMPARISON:  02/04/2006 FINDINGS: Endotracheal tube tip overlies the right mainstem bronchus. Low lung volumes. Atelectasis at the right greater than left base. Stable cardiomediastinal silhouette. No pneumothorax. Right-sided shunt tubing. Aortic atherosclerosis. IMPRESSION: Tip of the endotracheal tube overlies the proximal right mainstem bronchus Low lung volumes with basilar atelectasis Critical Value/emergent results were called by telephone at the time of interpretation on 11/09/2017 at 4:55 pm to Dr. Bary Castilla , who verbally acknowledged these results. Electronically Signed   By: Adrian Prows.D.  On: 10/31/2017 16:55     STUDIES:  Head Ct 1/17 >> chronic changes that include evidence of her left MCA aneurysm, stroke with severe left greater than right cerebral encephalomalacia.  Bilateral ventricular enlargement with a right VP shunt in place  CULTURES: Blood 1/17 >> Urine 1/17 >>   ANTIBIOTICS: Unasyn 1/17 >>   SIGNIFICANT EVENTS: Arrest and resuscitation 1/17  LINES/TUBES: PEG changed 09/2017 ETT 1/17 >>   DISCUSSION: 72 year old woman, debilitated from multiple strokes and aneurysm, dependent for her care.  She experienced a PEA arrest, apparently due to respiratory arrest although etiology not entirely clear.  CPR for 25 minutes.  She is exhibiting evidence for a brain injury with myoclonus.   No acute bleed noted on head CT.  Consider possible viral syndrome, possible aspiration event given her recent emesis although only mild abnormalities on chest x-ray.  We will support her with mechanical ventilation, pressors, empiric antibiotics for now.  Based on discussions with her son-in-law and daughter by phone they would not want her care significantly escalated.  If she were to decline I confirmed that they would want her to be DNR.  Based on this and given her overall prognosis I will defer hypothermia, defer central line placement.  Large priority will be to try to keep the patient stable until her daughter can arrive from out of town to see her if possible.  Depending on the endorgan injuries and her potential for recovery we will discuss possible withdrawal of care when her daughter arrives.  ASSESSMENT / PLAN:  PULMONARY A: Acute respiratory failure P:   PRVC 8 cc/kg Wean FiO2 as tolerated, continue PEEP 5 Pulmonary hygiene  CARDIOVASCULAR A:  PEA arrest Shock, cardiogenic plus possible septic, hypovolemic History hypertension P:  Attempt to wean epinephrine to off, increase norepinephrine to allow this Anticipated elevated troponin given duration of her CPR.  Not a candidate for cardiac catheterization or cardiac evaluation.  This will be deferred Hold her antihypertensives  RENAL A:   At high risk for acute renal injury Hypokalemia P:   Follow BMP, urine output Defer potassium replacement at this time, anticipate that potassium will rise as renal function worsens  GASTROINTESTINAL A:   Dysphasia, PEG in place P:   Hold tube feeding at this time  HEMATOLOGIC A:   Leukocytosis Anemia Thrombocytopenia P:  Follow CBC  INFECTIOUS A:   Possible aspiration event, aspiration pneumonia P:   Empiric Unasyn until able to assess for possible evolving pulmonary infiltrates Blood culture, urine culture pending  ENDOCRINE A:   Hyperglycemia P:   Insulin per ICU  protocol  NEUROLOGIC A:   History of severe debilitation due to strokes History of left MCA aneurysm clipping History of hydrocephalus with VP shunt in place Myoclonus, 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  Also spoke with her daughter by phone who is out of state and [place] back to [place].  They understand that these events carry a poor prognosis.  We are trying to maintain stability with her current interventions.  We will attempt to keep her stable until her daughter can get back to [place].  At that time we will assess her endorgan injuries, her prognosis and discuss next steps including possible withdrawal  of care.  She maintained that her mother should be DNR should she acutely decline before she can arrive.  - Inter-disciplinary family meet or Palliative Care meeting due by:  11/16/17   Independent CC time 72 minutes  Levy Pupa, MD, PhD 11/03/2017, 6:52 PM [facility] [phone] or if no answer [phone]

## 2017-11-09 NOTE — ED Notes (Signed)
PT VOMITING YELLOW/WHITE CREAMY CONTENTS. OG IN PLACE YET NOT VERIFIED BY X-RAY RACHAL K RN.  EDP CALLED TO BEDSIDE. VERBAL ORDERS TO PLACED TO SUCTION. GASTRIC CONTENTS RETURNED VERIFIED BY EDP APPROX. 500CC. PT MOUTH SUCTIONED TO CLEAR. FAMILY PRESENT. PT TOLERATED. PT STABLE TO TRANSFER TO CT. RRT PRESENT AND ASSISTING.

## 2017-11-09 NOTE — ED Notes (Signed)
Bag of NS with 1mg  epi bolus stopped as vasopressors from pharmacy started.

## 2017-11-09 NOTE — Code Documentation (Signed)
Family updated as to patient's status.

## 2017-11-09 NOTE — ED Notes (Signed)
CRITICAL CARE  Provider at bedside. FAMILY PRESENT

## 2017-11-09 NOTE — Code Documentation (Signed)
Family at beside. Family given emotional support. 

## 2017-11-10 ENCOUNTER — Inpatient Hospital Stay (HOSPITAL_COMMUNITY): Payer: Medicare Other

## 2017-11-10 LAB — CBC WITH DIFFERENTIAL/PLATELET
BASOS ABS: 0 10*3/uL (ref 0.0–0.1)
BASOS PCT: 0 %
Band Neutrophils: 23 %
EOS PCT: 0 %
Eosinophils Absolute: 0 10*3/uL (ref 0.0–0.7)
HCT: 28.3 % — ABNORMAL LOW (ref 36.0–46.0)
Hemoglobin: 9.1 g/dL — ABNORMAL LOW (ref 12.0–15.0)
LYMPHS ABS: 2.3 10*3/uL (ref 0.7–4.0)
Lymphocytes Relative: 14 %
MCH: 33.8 pg (ref 26.0–34.0)
MCHC: 32.2 g/dL (ref 30.0–36.0)
MCV: 105.2 fL — AB (ref 78.0–100.0)
MONO ABS: 0.8 10*3/uL (ref 0.1–1.0)
Metamyelocytes Relative: 13 %
Monocytes Relative: 5 %
Myelocytes: 12 %
NEUTROS PCT: 31 %
Neutro Abs: 13.1 10*3/uL — ABNORMAL HIGH (ref 1.7–7.7)
Other: 2 %
PLATELETS: 110 10*3/uL — AB (ref 150–400)
RBC: 2.69 MIL/uL — ABNORMAL LOW (ref 3.87–5.11)
RDW: 14.1 % (ref 11.5–15.5)
WBC MORPHOLOGY: INCREASED
WBC: 16.6 10*3/uL — ABNORMAL HIGH (ref 4.0–10.5)

## 2017-11-10 LAB — CBC
HEMATOCRIT: 37.8 % (ref 36.0–46.0)
Hemoglobin: 12.5 g/dL (ref 12.0–15.0)
MCH: 33.3 pg (ref 26.0–34.0)
MCHC: 33.1 g/dL (ref 30.0–36.0)
MCV: 100.8 fL — AB (ref 78.0–100.0)
Platelets: 67 10*3/uL — ABNORMAL LOW (ref 150–400)
RBC: 3.75 MIL/uL — ABNORMAL LOW (ref 3.87–5.11)
RDW: 14.3 % (ref 11.5–15.5)
WBC: 22.5 10*3/uL — ABNORMAL HIGH (ref 4.0–10.5)

## 2017-11-10 LAB — BASIC METABOLIC PANEL
Anion gap: 17 — ABNORMAL HIGH (ref 5–15)
BUN: 24 mg/dL — AB (ref 6–20)
CALCIUM: 8.8 mg/dL — AB (ref 8.9–10.3)
CO2: 15 mmol/L — AB (ref 22–32)
CREATININE: 1.56 mg/dL — AB (ref 0.44–1.00)
Chloride: 101 mmol/L (ref 101–111)
GFR calc Af Amer: 37 mL/min — ABNORMAL LOW (ref >60)
GFR calc non Af Amer: 32 mL/min — ABNORMAL LOW (ref >60)
Glucose, Bld: 27 mg/dL — CL (ref 65–99)
Potassium: 2.9 mmol/L — ABNORMAL LOW (ref 3.5–5.1)
Sodium: 133 mmol/L — ABNORMAL LOW (ref 135–145)

## 2017-11-10 LAB — BLOOD CULTURE ID PANEL (REFLEXED)
ACINETOBACTER BAUMANNII: NOT DETECTED
CANDIDA GLABRATA: NOT DETECTED
Candida albicans: NOT DETECTED
Candida krusei: NOT DETECTED
Candida parapsilosis: NOT DETECTED
Candida tropicalis: NOT DETECTED
Carbapenem resistance: NOT DETECTED
ENTEROBACTER CLOACAE COMPLEX: NOT DETECTED
ESCHERICHIA COLI: DETECTED — AB
Enterobacteriaceae species: DETECTED — AB
Enterococcus species: NOT DETECTED
Haemophilus influenzae: NOT DETECTED
Klebsiella oxytoca: NOT DETECTED
Klebsiella pneumoniae: NOT DETECTED
LISTERIA MONOCYTOGENES: NOT DETECTED
NEISSERIA MENINGITIDIS: NOT DETECTED
PSEUDOMONAS AERUGINOSA: NOT DETECTED
Proteus species: NOT DETECTED
STREPTOCOCCUS AGALACTIAE: NOT DETECTED
Serratia marcescens: NOT DETECTED
Staphylococcus aureus (BCID): NOT DETECTED
Staphylococcus species: NOT DETECTED
Streptococcus pneumoniae: NOT DETECTED
Streptococcus pyogenes: NOT DETECTED
Streptococcus species: NOT DETECTED

## 2017-11-10 LAB — GLUCOSE, CAPILLARY
GLUCOSE-CAPILLARY: 90 mg/dL (ref 65–99)
Glucose-Capillary: 20 mg/dL — CL (ref 65–99)

## 2017-11-10 MED ORDER — CHLORHEXIDINE GLUCONATE 0.12% ORAL RINSE (MEDLINE KIT): 15.0000 mL | Freq: Two times a day (BID) | OROMUCOSAL | Status: DC

## 2017-11-10 MED ORDER — ORAL CARE MOUTH RINSE: 15.0000 mL | Freq: Four times a day (QID) | OROMUCOSAL | Status: DC

## 2017-11-10 MED ORDER — DEXTROSE-NACL 5-0.9 % IV SOLN
INTRAVENOUS | Status: DC
  Administered 2017-11-10: 04:00:00 via INTRAVENOUS

## 2017-11-10 MED ORDER — DEXTROSE 50 % IV SOLN
INTRAVENOUS | Status: AC
  Administered 2017-11-10: 50 mL
  Filled 2017-11-10: qty 50

## 2017-11-12 LAB — CULTURE, BLOOD (ROUTINE X 2)
SPECIAL REQUESTS: ADEQUATE
Special Requests: ADEQUATE

## 2017-11-12 LAB — URINE CULTURE: Culture: >=100000 — AB

## 2017-11-16 ENCOUNTER — Telehealth: Payer: Self-pay

## 2017-11-16 NOTE — Telephone Encounter (Signed)
On 11/16/17 I received a d/c from [facility] (original). The d/c is for burial. The patient is a patient of Doctor Byrum. The d/c will be taken to Pulmonary Unit @ [facility] for signature.  On 11/21/17 I received the d/c back from Doctor Byrum. I got the d/c ready and called the funeral home to let them know the d/c is ready for pickup.

## 2017-11-24 NOTE — Care Management Note (Signed)
Case Management Note  Patient Details  Name: Emma [name] MRN: [id number] Date of Birth: 12/20/1945  Subjective/Objective:                  Unresponsive and cpr/intubated  Action/Plan: Date: November 10, 2017 Marcelle SmilingRhonda Chaires, BSN, [place]3, [place] [phone] Chart and notes review for patient progress and needs. Will follow for case management and discharge needs. No cm or discharge needs present at time of this review. Next review date: [id number]  Expected Discharge Date:  (unknown)               Expected Discharge Plan:  Home/Self Care  In-House Referral:     Discharge planning Services  CM Consult  Post Acute Care Choice:    Choice offered to:     DME Arranged:    DME Agency:     HH Arranged:    HH Agency:     Status of Service:  In process, will continue to follow  If discussed at Long Length of Stay Meetings, dates discussed:    Additional Comments:  [name], [name], RN 11/04/2017, 8:30 AM

## 2017-11-24 NOTE — Progress Notes (Signed)
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  She is cared for by her family with 24-hour care assistance.  History is given by her son-in-law.  She had been well until last week when she experienced some emesis.  This seemed to resolve and has since been tolerating tube feeding.  On the date of admission at home he noted that she had a rapid shallow breathing pattern.  He went to interact with her and instead of being able to squeeze his hand (which she can typically do) she was only able to reach out with tremor.  No documented fevers, other new symptoms.  Question whether the tremor reflected a chill.  He brought her to the emergency department for evaluation but notes that just as they were pulling up to the department she became lethargic, probably stopped breathing.  She was brought immediately into the emergency department and CPR was initiated.  She achieved [facility] at approximately 25 minutes.  She achieved a blood pressure that could be measured on epinephrine 14, norepinephrine 30.  Discussions were undertaken with the patient's daughter by phone by Dr. Corlis Leak.  She confirmed that given the course and the lack of benefit that no more CPR should be undertaken should her mother decompensate.  She will be admitted to the ICU  SUBJECTIVE / Interval events:  Hypotensive this morning on my arrival on pressors. Clarified her goals with her son-in-law Marcial Pacas..  While I was on the unit the patient devolved to bradycardia and then asystole.  No interventions or ACLS were initiated.  She passed peacefully.   VITAL SIGNS: BP (!) 57/34   Pulse (!) 113   Temp (!) 101.3 F (38.5 C) (Core)    Resp (!) 35   Ht 5\' 1"  (1.549 m)   Wt 49.7 kg (109 lb 9.1 oz)   SpO2 90%   BMI 20.70 kg/m   HEMODYNAMICS:    VENTILATOR SETTINGS: Vent Mode: PRVC FiO2 (%):  [60 %-100 %] 60 % Set Rate:  [14 bmp-24 bmp] 24 bmp Vt Set:  [40 mL-400 mL] 400 mL PEEP:  [5 cmH20] 5 cmH20 Plateau Pressure:  [11 cmH20-23 cmH20] 21 cmH20  INTAKE / OUTPUT: I/O last 3 completed shifts: In: 1254.3 [I.V.:1154.3; IV Piggyback:100] Out: 1575 [Urine:150; Emesis/NG output:225; Other:1200]  PHYSICAL EXAMINATION: General:  Ill appearing elderly woman, intubated and ventilated.  Neuro:  Unresponsive, some myoclonic jerks when stimulated. Intact cough. Intact spontaneous resp drive HEENT:  ETT in place, some clear secretions. Pupils pinpoint B, not reactive.   Cardiovascular:  Regular, no M heard Lungs:  Coarse BS bilaterally Abdomen:  Somewhat firm, PEG site CDI Musculoskeletal: UE contracture Skin:  No rash or lesions.   LABS:  BMET Recent Labs  Lab Nov 16, 2017 1627 11/11/2017 0318  NA 129* 133*  K 2.7* 2.9*  CL 94* 101  CO2 16* 15*  BUN 20 24*  CREATININE 1.11* 1.56*  GLUCOSE 412* 27*    Electrolytes Recent Labs  Lab 2017/11/16 1627 11/21/2017 0318  CALCIUM 9.8 8.8*    CBC Recent Labs  Lab 11-16-2017 1627 11/18/2017 0318  WBC 16.6* 22.5*  HGB 9.1* 12.5  HCT 28.3* 37.8  PLT 110* 67*  Coag's No results for input(s): APTT, INR in the last 168 hours.  Sepsis Markers Recent Labs  Lab 2017/11/21 1637  LATICACIDVEN >17.00*    ABG Recent Labs  Lab 11-21-17 1745  PHART 7.094*  PCO2ART 35.1  PO2ART 288*    Liver Enzymes Recent Labs  Lab November 21, 2017 1627  AST 161*  ALT 114*  ALKPHOS 385*  BILITOT 2.2*  ALBUMIN 1.8*    Cardiac Enzymes No results for input(s): TROPONINI, PROBNP in the last 168 hours.  Glucose Recent Labs  Lab 21-Nov-2017 2323 11/01/2017 0326 11/09/2017 0410  GLUCAP 86 20* 90    Imaging Ct Head Wo Contrast  Result Date: 21-Nov-2017 CLINICAL DATA:  Altered  mental status.  History of brain aneurysm. EXAM: CT HEAD WITHOUT CONTRAST TECHNIQUE: Contiguous axial images were obtained from the base of the skull through the vertex without intravenous contrast. COMPARISON:  CT HEAD April 19, 2006 FINDINGS: BRAIN: No intraparenchymal hemorrhage, mass effect, midline shift or acute large vascular territory infarcts. LEFT cerebral atrophy with ex vacuo dilatation subjacent cyst/porencephalic. Ventriculoperitoneal shunt via RIGHT parietal burr hole, distal tip in frontal horn RIGHT lateral ventricle. No hydrocephalus. Small area RIGHT frontal lobe encephalomalacia likely reflecting old catheter track considering subjacent location 2 burr hole. No abnormal extra-axial fluid collections. Basal cisterns are patent. VASCULAR: Status post clipping LEFT MCA aneurysm. SKULL: No skull fracture. Old LEFT frontal craniotomy and RIGHT frontal burr hole. Severe temporomandibular osteoarthrosis. No significant scalp soft tissue swelling. SINUSES/ORBITS: The mastoid air-cells and included paranasal sinuses are well-aerated.The included ocular globes and orbital contents are non-suspicious. OTHER: Life-support lines in place. IMPRESSION: 1. No acute intracranial process. 2. RIGHT ventriculoperitoneal shunt, no hydrocephalus. 3. 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 Status post LEFT MCA aneurysm clipping. 4. RIGHT frontal lobe encephalomalacia, likely along prior catheter tract. Electronically Signed   By: Awilda Metro M.D.   On: 2017-11-21 18:28   Dg Chest Port 1 View  Result Date: 11/13/2017 CLINICAL DATA:  Hypoxia EXAM: PORTABLE CHEST 1 VIEW COMPARISON:  November 21, 2017 FINDINGS: Endotracheal tube tip is 2.1 cm above the carina. Nasogastric tube tip and side port are in the stomach. No pneumothorax. There is a right pleural effusion with right base consolidation medially. There is mild atelectasis in the left mid and lower lung zones.  Left lung otherwise is clear. Heart is upper normal in size with pulmonary vascularity within normal limits. There is aortic atherosclerosis. No adenopathy. No evident bone lesions. IMPRESSION: Tube positions as described without pneumothorax. Airspace consolidation medial right base with right pleural effusion. Mild atelectasis left mid lower lung zones. Stable cardiac silhouette. There is aortic atherosclerosis. Aortic Atherosclerosis (ICD10-I70.0). Electronically Signed   By: Bretta Bang III M.D.   On: 11/06/2017 07:14   Dg Chest Port 1 View  Result Date: 21-Nov-2017 CLINICAL DATA:  ETT placement EXAM: PORTABLE CHEST 1 VIEW COMPARISON:  02/04/2006 FINDINGS: Endotracheal tube tip overlies the right mainstem bronchus. Low lung volumes. Atelectasis at the right greater than left base. Stable cardiomediastinal silhouette. No pneumothorax. Right-sided shunt tubing. Aortic atherosclerosis. IMPRESSION: Tip of the endotracheal tube overlies the proximal right mainstem bronchus Low lung volumes with basilar atelectasis Critical Value/emergent results were called by telephone at the time of interpretation on 2017/11/21 at 4:55 pm to Dr. Bary Castilla , who verbally acknowledged these results. Electronically Signed   By: Jasmine Pang M.D.   On: 11/21/2017 16:55     STUDIES:  Head Ct 1/17 >> chronic changes that  include evidence of her left MCA aneurysm, stroke with severe left greater than right cerebral encephalomalacia.  Bilateral ventricular enlargement with a right VP shunt in place  CULTURES: Blood 1/17 >> Urine 1/17 >>   ANTIBIOTICS: Unasyn 1/17 >>   SIGNIFICANT EVENTS: Arrest and resuscitation 1/17  LINES/TUBES: PEG changed 09/2017 ETT 1/17 >>   DISCUSSION: 72 year old woman, debilitated from multiple strokes and aneurysm, dependent for her care.  She experienced a PEA arrest, apparently due to respiratory arrest although etiology not entirely clear.  CPR for 25 minutes.  She is  exhibiting evidence for a brain injury with myoclonus.  No acute bleed noted on head CT.  Consider possible viral syndrome, possible aspiration event given her recent emesis although only mild abnormalities on chest x-ray.  We will support her with mechanical ventilation, pressors, empiric antibiotics for now.  Based on discussions with her son-in-law and daughter by phone they would not want her care significantly escalated.  If she were to decline I confirmed that they would want her to be DNR.  Based on this and given her overall prognosis I will defer hypothermia, defer central line placement.  Large priority will be to try to keep the patient stable until her daughter can arrive from out of town to see her if possible.  Depending on the endorgan injuries and her potential for recovery we will discuss possible withdrawal of care when her daughter arrives.  ASSESSMENT / PLAN:  PULMONARY A: Acute respiratory failure P:   Supported with PRVC 8 cc/kg We will plan to extubate so that her daughter can see her without endotracheal tube when she arrives at bedside  CARDIOVASCULAR A:  PEA arrest Shock, cardiogenic plus possible septic, hypovolemic History hypertension P:   RENAL A:   Acute renal failure Hypokalemia P:   Labs from 1/18 confirm rising creatinine  GASTROINTESTINAL A:   Dysphasia, PEG in place P:   No intervention  HEMATOLOGIC A:   Leukocytosis Anemia Thrombocytopenia P:  CBC 1/18 reviewed  INFECTIOUS A:   Right lower lobe infiltrate, probable aspiration pneumonia P:   Treated empirically with Unasyn  ENDOCRINE A:   Hyperglycemia P:   Discontinue fingersticks  NEUROLOGIC A:   History of severe debilitation due to strokes History of left MCA aneurysm clipping History of hydrocephalus with VP shunt in place Myoclonus, 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  Unfortunately she expired on full support this morning.  I reviewed this with her son-in-law Marcial Pacasimothy.  We will extubate, clean her up in preparation for the arrival of her daughter in the next few hours.    Levy Pupaobert Merla Sawka, MD, PhD 10-24-2018, 9:17 AM  [facility] [phone] or if no answer [phone]

## 2017-11-24 NOTE — Progress Notes (Signed)
Hypoglycemic Event  CBG: 20  Treatment: D50 amp  Symptoms: none  Follow-up CBG: Time:0400 CBG Result:90  Possible Reasons for Event: NPO status  Comments/MD notified: CCM    Lorin PicketScott, [place]

## 2017-11-24 NOTE — Discharge Summary (Signed)
PULMONARY / CRITICAL CARE MEDICINE   Name: Emma [name] MRN: [id number] DOB: [birth year]    ADMISSION DATE:  06-18-18  DATE OF DEATH: 10/26/2017  REFERRING MD:  Dr Corlis LeakMackuen, ED  CHIEF COMPLAINT:  PEA arrest  FINAL CAUSE OF DEATH: Anoxic brain injury, anoxic encephalopathy  SECONDARY CAUSES OF DEATH: PEA arrest Acute respiratory failure E. coli bacteremia Morganella morganii urinary tract infection Probable right lower lobe aspiration pneumonia Septic shock, also contribution hypovolemic and cardiogenic Acute renal failure Severe metabolic acidosis, lactic acidosis Shock liver Stress-NSTEMI Hyperglycemia Leukocytosis Thrombocytopenia Anemia of chronic disease History of hypertension History of chronic debilitation due to prior stroke and aneurysm 2007 History hydrocephalus with VP shunt in place History dysphagia with PEG tube   HISTORY OF PRESENT ILLNESS:   72 year old woman with history of hypertension, prior stroke and aneurysm in 2007, 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  She is cared for by her family with 24-hour care assistance.  History is given by her son-in-law.  She had been well until last week when she experienced some emesis.  This seemed to resolve and has since been tolerating tube feeding.  On the date of admission at home he noted that she had a rapid shallow breathing pattern.  He went to interact with her and instead of being able to squeeze his hand (which she can typically do) she was only able to reach out with tremor.  No documented fevers, other new symptoms.  Question whether the tremor reflected a chill.  He brought her to the emergency department for evaluation but notes that just as they were pulling up to the department she became lethargic, probably stopped breathing.  She was brought immediately into the emergency department and CPR was initiated.  [facility]  She achieved a blood  pressure that could be measured on epinephrine 14, norepinephrine 30.  Discussions were undertaken with the patient's daughter by phone by Dr. Corlis LeakMackuen.  She confirmed that given the course and the lack of benefit that no more CPR should be undertaken should her mother decompensate.  Therapeutic hypothermia was deferred given her overall clinical condition, the nature of her arrest.  Efforts were made to keep her stable with mechanical ventilation, pressors, empiric antibiotics, so that family could come to bedside to be with her.  She experienced progressive shock despite all support.  She experienced PEA and then asystole on 11/06/2017 with her son-in-law Marcial Pacasimothy at bedside, [place].  Culture data that matured following her death showed E. coli bacteremia, Morganella morganii urinary tract infection.  STUDIES:  Head Ct 1/17 >> chronic changes that include evidence of her left MCA aneurysm, stroke with severe left greater than right cerebral encephalomalacia.  Bilateral ventricular enlargement with a right VP shunt in place  CULTURES: Blood 1/17 >> E. coli, intermediate to Unasyn Urine 1/17 >> Morganella morganii, resistant to Unasyn  ANTIBIOTICS: Unasyn 1/17 >>   SIGNIFICANT EVENTS: Arrest and resuscitation 1/17  LINES/TUBES: PEG changed 09/2017 ETT 1/17 >>    Levy Pupaobert Byrum, MD, PhD 11/23/2017, 8:27 AM [facility] [phone] or if no answer [phone]

## 2017-11-24 NOTE — Progress Notes (Signed)
eLink Physician-Brief Progress Note Patient Name: Emma ShiLois Bonanno DOB: 06-09-1946 MRN: [id number]   Date of Service  11/02/2017  HPI/Events of Note  Hypoglycemia.  Per protocol received D50  eICU Interventions  IVFs changed to D5NS at 50 cc/hr     Intervention Category Intermediate Interventions: Other:  [name],Emma 11/08/2017, 3:37 AM

## 2017-11-24 NOTE — Progress Notes (Signed)
At 0815 pt lost pulse and monitor read asystole. Two RN's, Harlin Heysllison Koonts and Les PouSara Beth Early individually listened to the pt for 2mins and neither heard heart or lung sounds. Son-in-law, Maisie Fushomas was at bedside. MD at bedside and pronounced the pt deceased.  Strips printed at 0816 reading asystole.

## 2017-11-24 DEATH — deceased

## 2018-03-26 ENCOUNTER — Other Ambulatory Visit (HOSPITAL_COMMUNITY): Payer: Medicare Other

## 2019-12-09 IMAGING — CT CT HEAD W/O CM
3 series · 15 of 46 positions shown, 18 images · non-contrast
Comparison: CT HEAD April 19, 2006

CLINICAL DATA: Altered mental status.  History of brain aneurysm.

EXAM:
CT HEAD WITHOUT CONTRAST
TECHNIQUE: Contiguous axial images were obtained from the base of the skull
through the vertex without intravenous contrast.

[Series 2: head wo · axial · 0.41mm/px · z∈[+1179,+1299]mm · 9 of 29 slices shown, 12 images]
[im 3/29  brain]
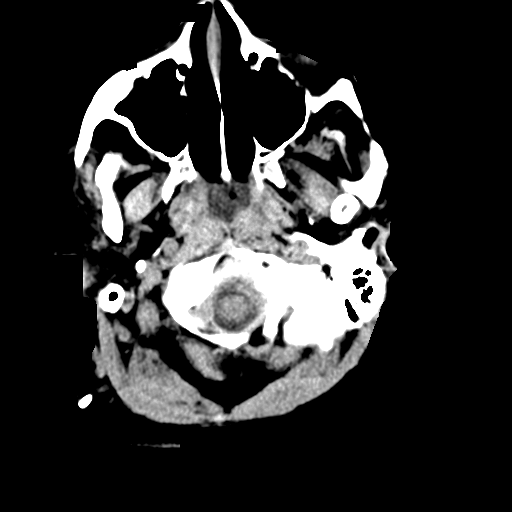
[im 3/29  bone]
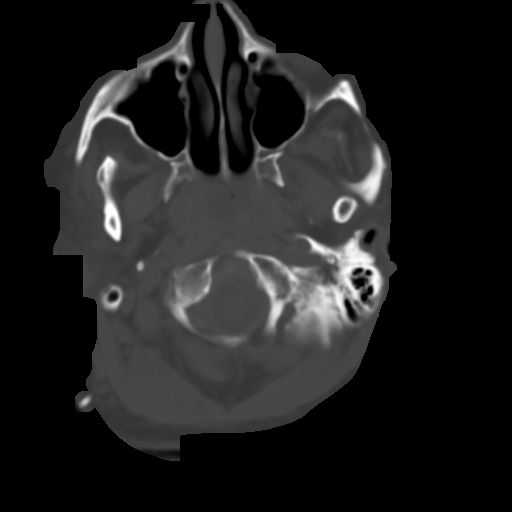
[im 6/29  brain]
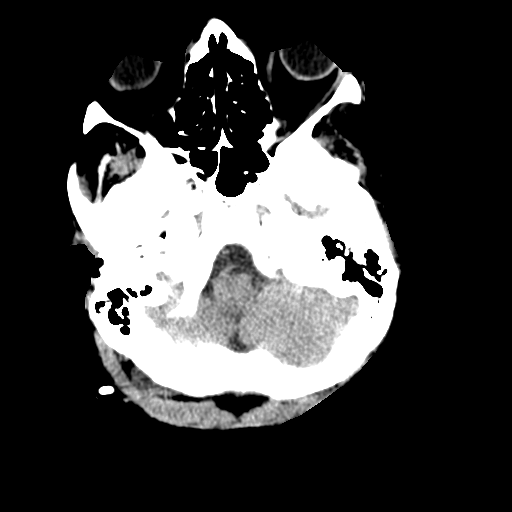
[im 9/29  brain]
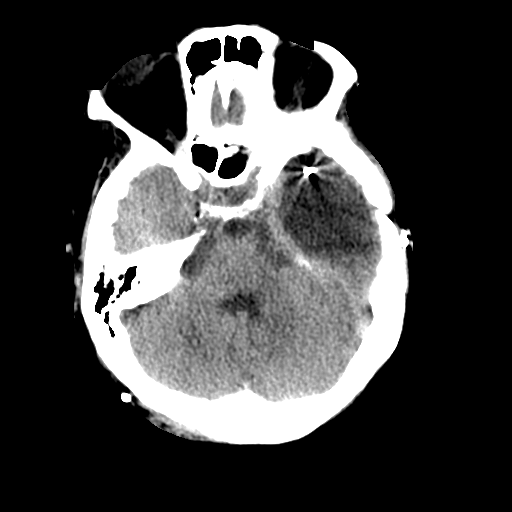
[im 12/29  brain]
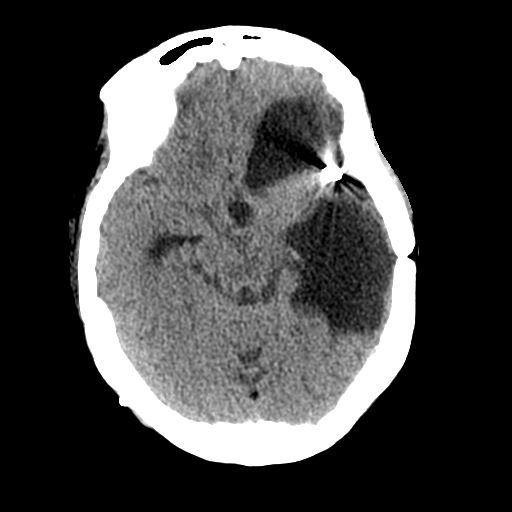
[im 15/29  brain]
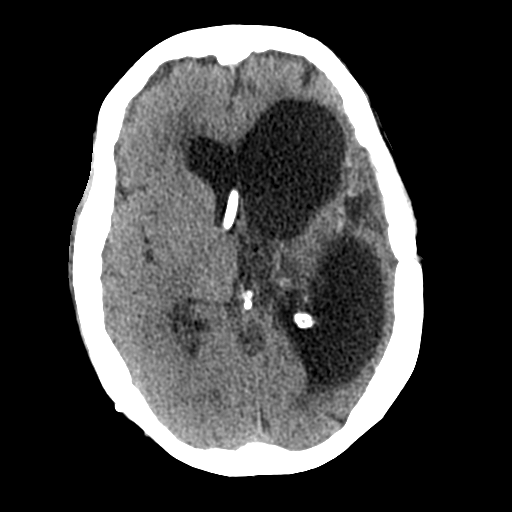
[im 15/29  bone]
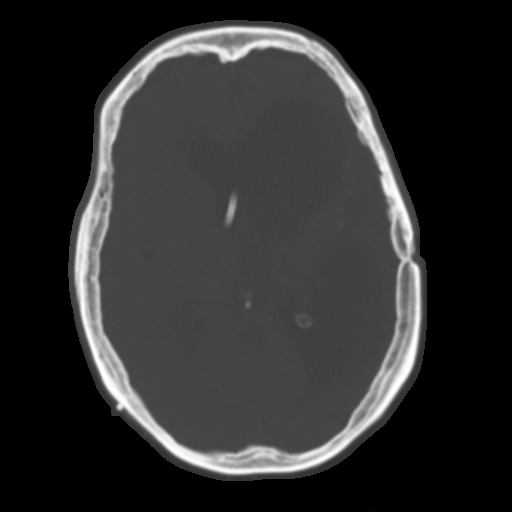
[im 18/29  brain]
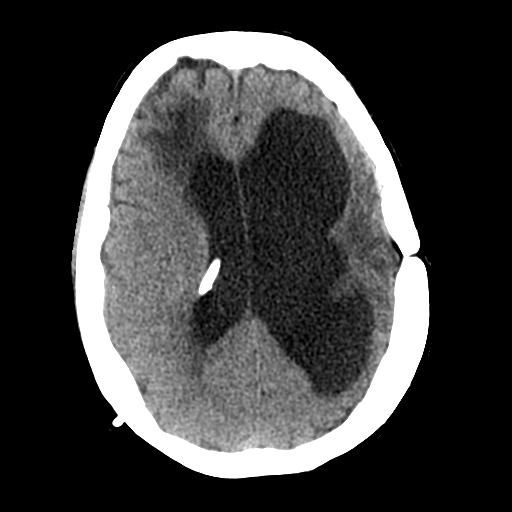
[im 21/29  brain]
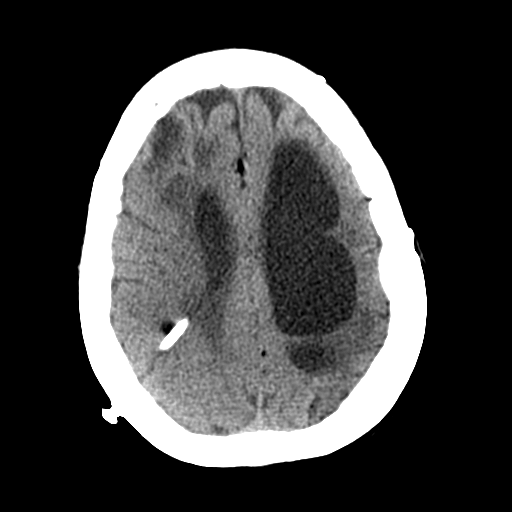
[im 24/29  brain]
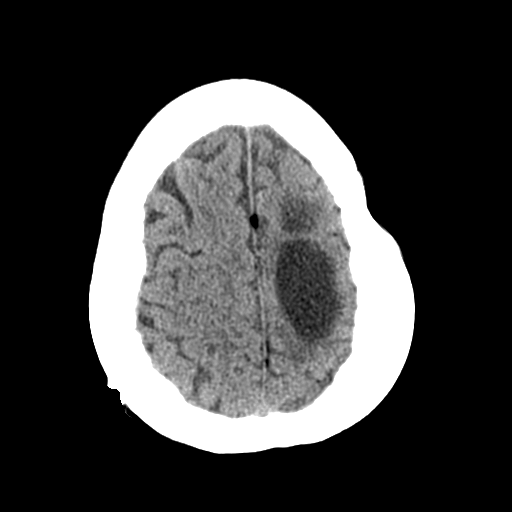
[im 27/29  brain]
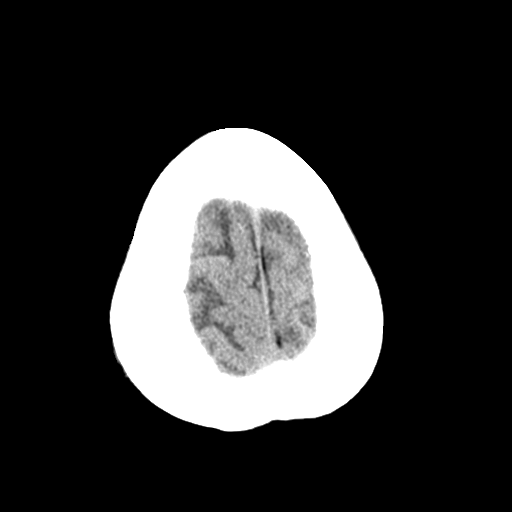
[im 27/29  bone]
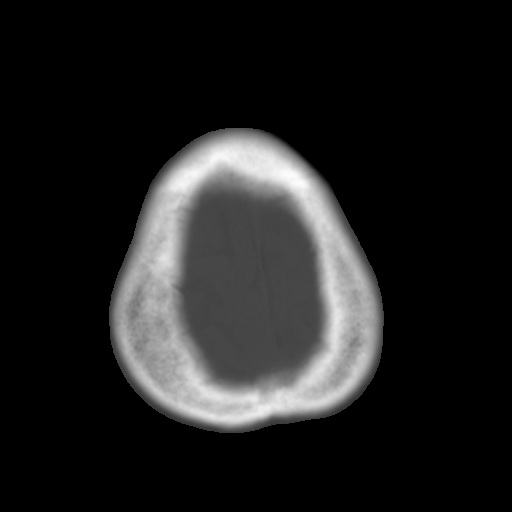

[Series 5: coronal soft tissue · coronal · 0.30mm/px · 3 of 65 slices shown]
[im 22/65  brain]
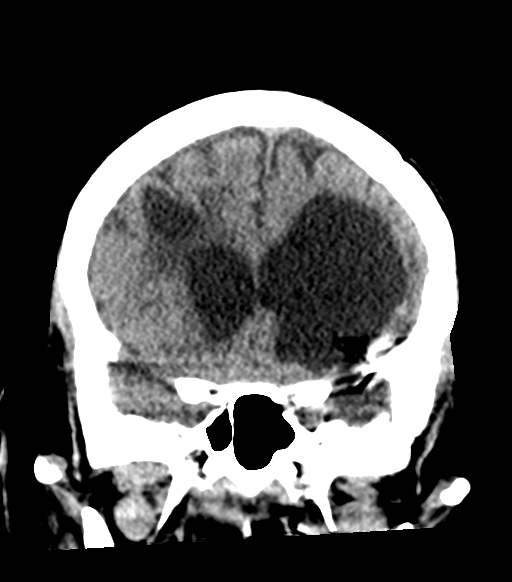
[im 29/65  brain]
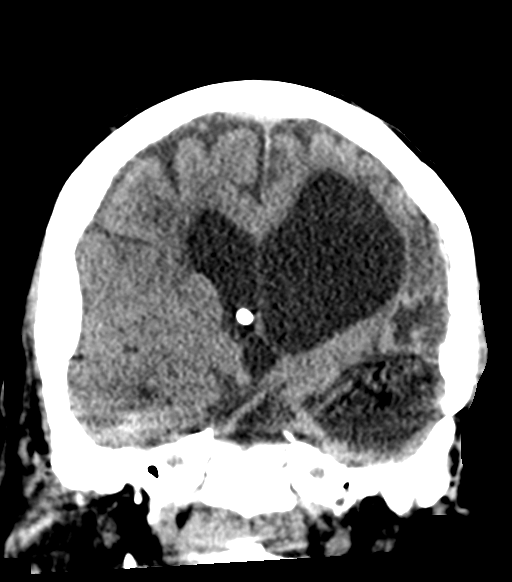
[im 36/65  brain]
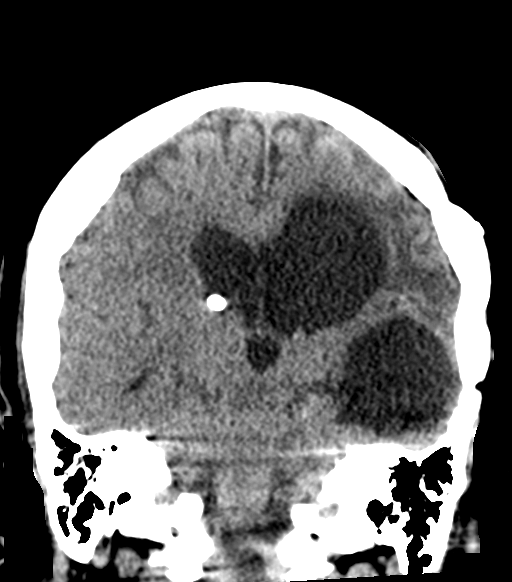

[Series 6: sagittal soft tissue · sagittal · 0.32mm/px · 3 of 54 slices shown]
[im 18/54  brain]
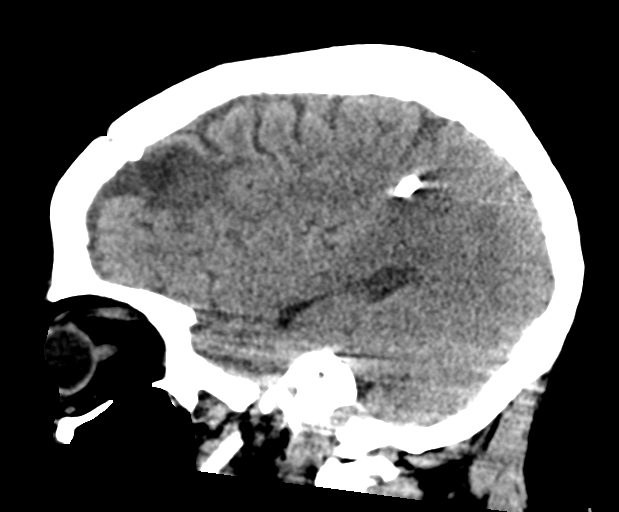
[im 27/54  brain]
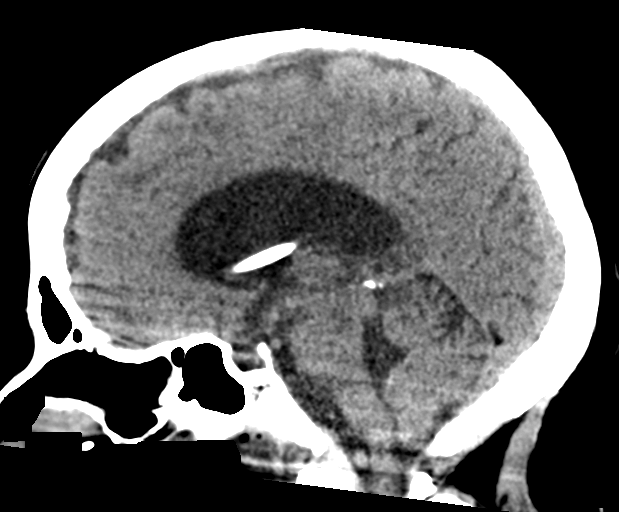
[im 36/54  brain]
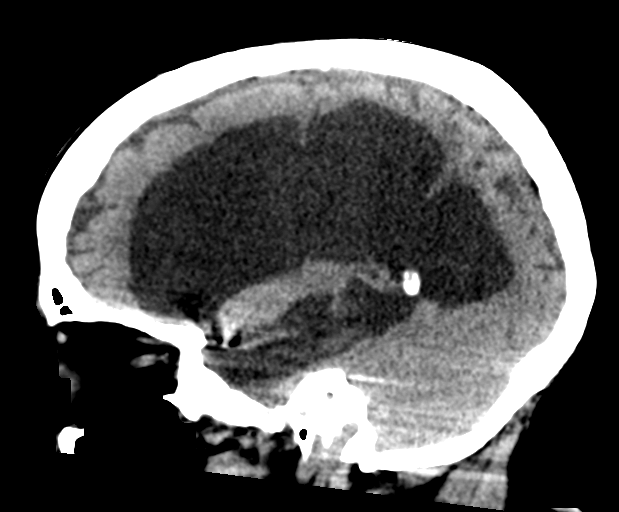

[15 of 46 positions shown; findings below may reference images not displayed]

FINDINGS: BRAIN: No intraparenchymal hemorrhage, mass effect, midline shift or
acute large vascular territory infarcts. LEFT cerebral atrophy with
ex vacuo dilatation subjacent cyst/porencephalic.
Ventriculoperitoneal shunt via RIGHT parietal burr hole, distal tip
in frontal horn RIGHT lateral ventricle. No hydrocephalus. Small
area RIGHT frontal lobe encephalomalacia likely reflecting old
catheter track considering subjacent location 2 burr hole.. No
abnormal extra-axial fluid collections. Basal cisterns are patent.

VASCULAR: Status post clipping LEFT MCA aneurysm.

SKULL: No skull fracture. Old LEFT frontal craniotomy and RIGHT
frontal burr hole. Severe temporomandibular osteoarthrosis. No
significant scalp soft tissue swelling.

SINUSES/ORBITS: The mastoid air-cells and included paranasal sinuses
are well-aerated.The included ocular globes and orbital contents are
non-suspicious.

OTHER: Life-support lines in place.
IMPRESSION: 1. No acute intracranial process.
2. RIGHT ventriculoperitoneal shunt, no hydrocephalus.
3. Severe LEFT cerebrum encephalomalacia compatible with multi
vascular territory infarcts. Status post LEFT MCA aneurysm clipping.
4. RIGHT frontal lobe encephalomalacia, likely along prior catheter
tract.

## 2019-12-09 IMAGING — DX DG CHEST 1V PORT
1 series · 1 of 1 positions shown · non-contrast
Comparison: 02/04/2006

CLINICAL DATA: ETT placement

EXAM:
PORTABLE CHEST 1 VIEW

[chest ap]
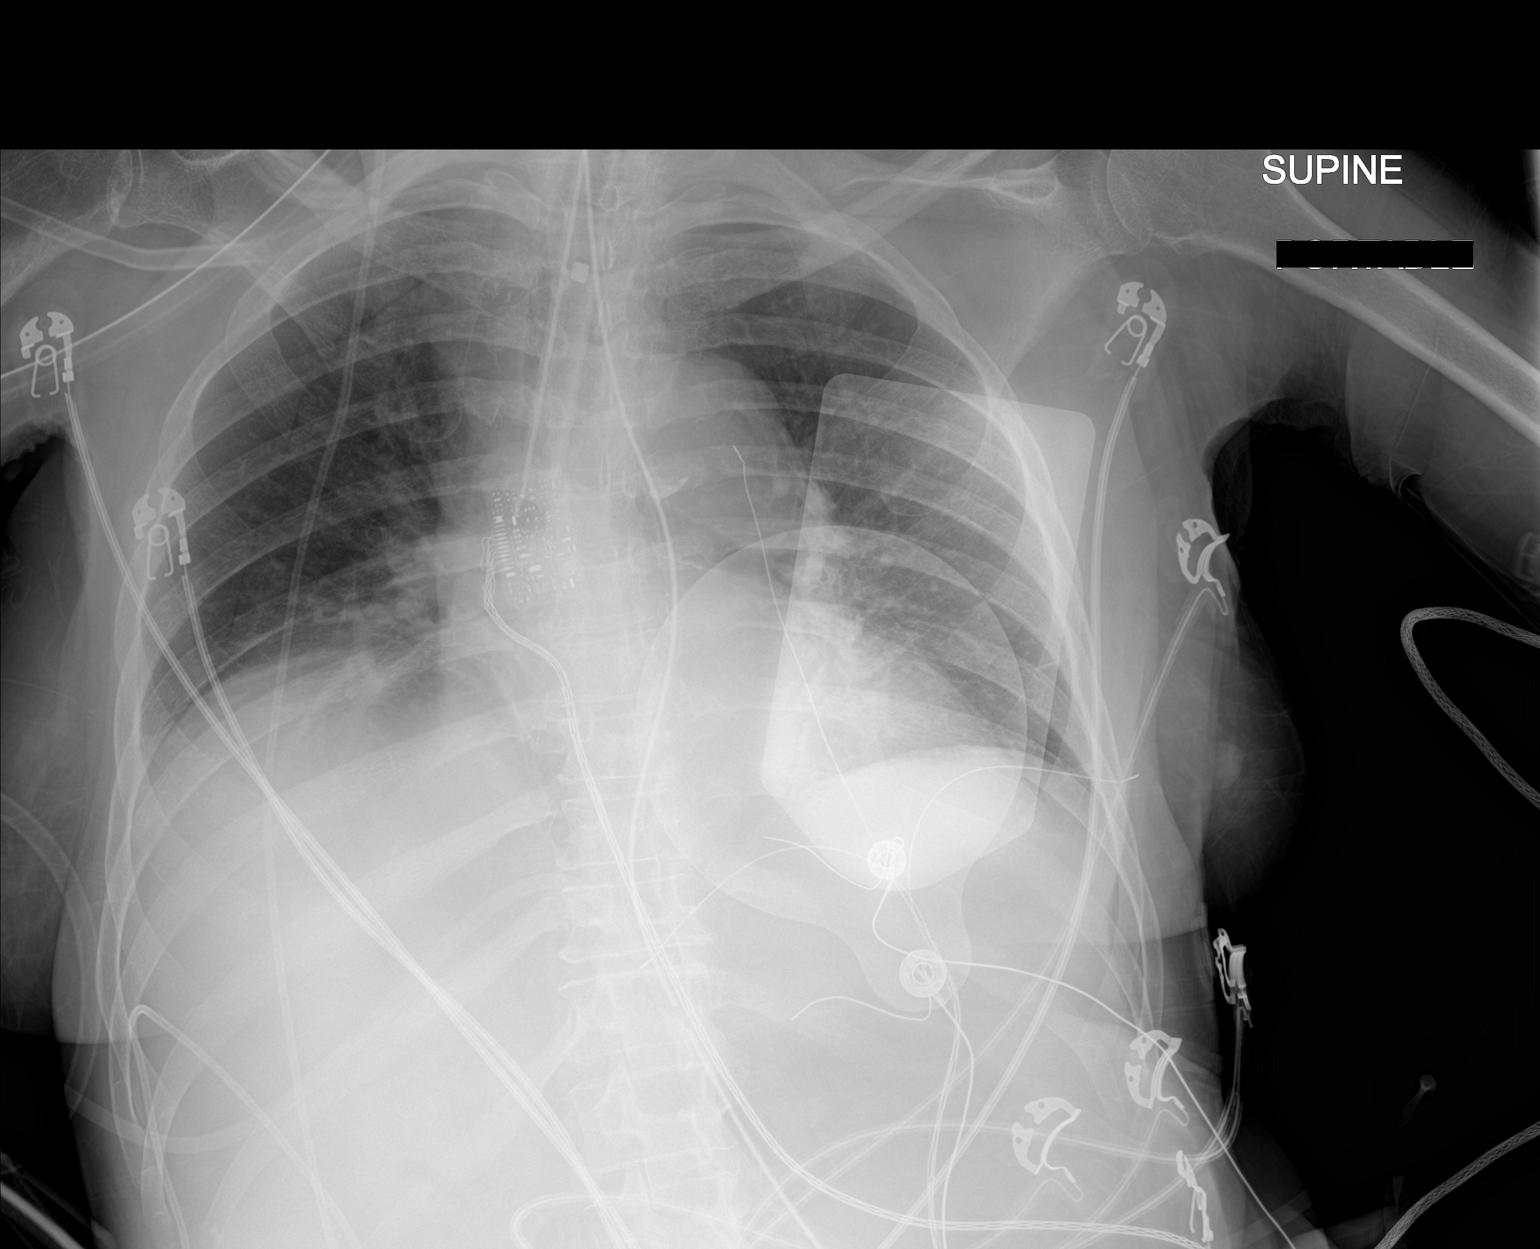

[1 of 1 positions shown; findings below may reference images not displayed]

FINDINGS: Endotracheal tube tip overlies the right mainstem bronchus. Low lung
volumes. Atelectasis at the right greater than left base. Stable
cardiomediastinal silhouette. No pneumothorax. Right-sided shunt
tubing. Aortic atherosclerosis.
IMPRESSION: Tip of the endotracheal tube overlies the proximal right mainstem
bronchus

Low lung volumes with basilar atelectasis

Critical Value/emergent results were called by telephone at the time
of interpretation on 11/09/2017 at [DATE] to Dr. RAMSES TARANGO ,
who verbally acknowledged these results.

## 2019-12-10 IMAGING — DX DG CHEST 1V PORT
1 series · 1 of 1 positions shown · non-contrast
Comparison: November 09, 2017

CLINICAL DATA: Hypoxia

EXAM:
PORTABLE CHEST 1 VIEW

[chest ap]
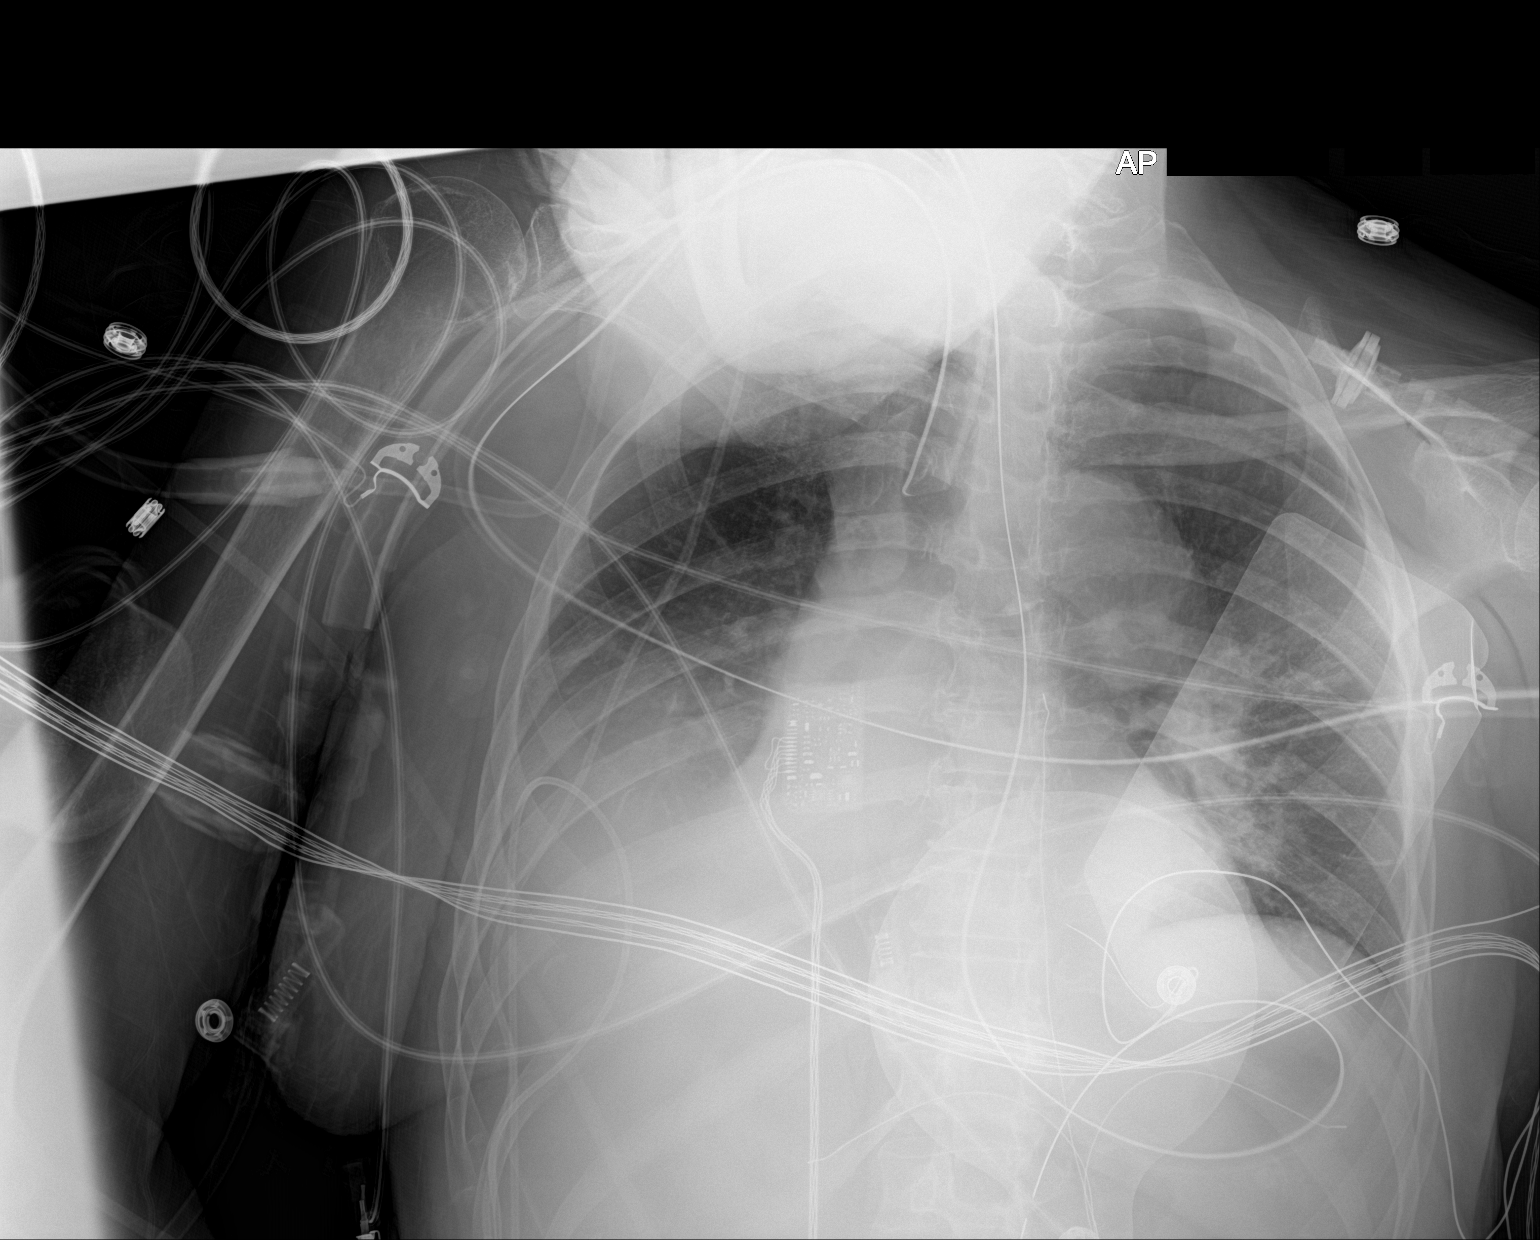

[1 of 1 positions shown; findings below may reference images not displayed]

FINDINGS: Endotracheal tube tip is 2.1 cm above the carina. Nasogastric tube
tip and side port are in the stomach. No pneumothorax. There is a
right pleural effusion with right base consolidation medially. There
is mild atelectasis in the left mid and lower lung zones. Left lung
otherwise is clear. Heart is upper normal in size with pulmonary
vascularity within normal limits. There is aortic atherosclerosis.
No adenopathy. No evident bone lesions.
IMPRESSION: Tube positions as described without pneumothorax. Airspace
consolidation medial right base with right pleural effusion. Mild
atelectasis left mid lower lung zones. Stable cardiac silhouette.
There is aortic atherosclerosis.

Aortic Atherosclerosis ([id number]).
